# Patient Record
Sex: Female | Born: 1935 | Race: White | Hispanic: No | Smoking: Former smoker
Health system: Southern US, Community
[De-identification: ages and names within clinical notes are randomized; demographics above are authoritative.]

## PROBLEM LIST (undated history)

## (undated) DIAGNOSIS — Z973 Presence of spectacles and contact lenses: Secondary | ICD-10-CM

## (undated) DIAGNOSIS — I10 Essential (primary) hypertension: Secondary | ICD-10-CM

## (undated) DIAGNOSIS — T148XXA Other injury of unspecified body region, initial encounter: Secondary | ICD-10-CM

## (undated) DIAGNOSIS — M1990 Unspecified osteoarthritis, unspecified site: Secondary | ICD-10-CM

## (undated) HISTORY — PX: CATARACT EXTRACTION, BILATERAL: SHX1313

## (undated) HISTORY — PX: COLONOSCOPY W/ BIOPSIES AND POLYPECTOMY: SHX1376

## (undated) HISTORY — PX: PARTIAL HYSTERECTOMY: SHX80

## (undated) HISTORY — PX: FRACTURE SURGERY: SHX138

## (undated) HISTORY — PX: BREAST SURGERY: SHX581

---

## 1997-12-01 ENCOUNTER — Other Ambulatory Visit: Admission: RE | Admit: 1997-12-01 | Discharge: 1997-12-01 | Payer: Self-pay | Admitting: Obstetrics and Gynecology

## 1997-12-20 ENCOUNTER — Ambulatory Visit (HOSPITAL_COMMUNITY): Admission: RE | Admit: 1997-12-20 | Discharge: 1997-12-20 | Payer: Self-pay | Admitting: Internal Medicine

## 1998-12-05 ENCOUNTER — Other Ambulatory Visit: Admission: RE | Admit: 1998-12-05 | Discharge: 1998-12-05 | Payer: Self-pay | Admitting: Obstetrics and Gynecology

## 1998-12-06 ENCOUNTER — Ambulatory Visit (HOSPITAL_COMMUNITY): Admission: RE | Admit: 1998-12-06 | Discharge: 1998-12-06 | Payer: Self-pay | Admitting: Obstetrics and Gynecology

## 1998-12-06 ENCOUNTER — Encounter: Payer: Self-pay | Admitting: Obstetrics and Gynecology

## 1999-11-06 ENCOUNTER — Encounter: Payer: Self-pay | Admitting: Obstetrics and Gynecology

## 1999-11-06 ENCOUNTER — Ambulatory Visit (HOSPITAL_COMMUNITY): Admission: RE | Admit: 1999-11-06 | Discharge: 1999-11-06 | Payer: Self-pay | Admitting: Obstetrics and Gynecology

## 2000-01-08 ENCOUNTER — Other Ambulatory Visit: Admission: RE | Admit: 2000-01-08 | Discharge: 2000-01-08 | Payer: Self-pay | Admitting: Obstetrics and Gynecology

## 2000-11-06 ENCOUNTER — Encounter: Payer: Self-pay | Admitting: Obstetrics and Gynecology

## 2000-11-06 ENCOUNTER — Ambulatory Visit (HOSPITAL_COMMUNITY): Admission: RE | Admit: 2000-11-06 | Discharge: 2000-11-06 | Payer: Self-pay | Admitting: Obstetrics and Gynecology

## 2001-11-07 ENCOUNTER — Ambulatory Visit (HOSPITAL_COMMUNITY): Admission: RE | Admit: 2001-11-07 | Discharge: 2001-11-07 | Payer: Self-pay | Admitting: Obstetrics and Gynecology

## 2001-11-07 ENCOUNTER — Encounter: Payer: Self-pay | Admitting: Obstetrics and Gynecology

## 2002-11-02 ENCOUNTER — Ambulatory Visit (HOSPITAL_COMMUNITY): Admission: RE | Admit: 2002-11-02 | Discharge: 2002-11-02 | Payer: Self-pay | Admitting: Gastroenterology

## 2002-11-09 ENCOUNTER — Encounter: Payer: Self-pay | Admitting: Obstetrics and Gynecology

## 2002-11-09 ENCOUNTER — Ambulatory Visit (HOSPITAL_COMMUNITY): Admission: RE | Admit: 2002-11-09 | Discharge: 2002-11-09 | Payer: Self-pay | Admitting: Obstetrics and Gynecology

## 2003-01-27 ENCOUNTER — Other Ambulatory Visit: Admission: RE | Admit: 2003-01-27 | Discharge: 2003-01-27 | Payer: Self-pay | Admitting: Obstetrics and Gynecology

## 2003-11-08 ENCOUNTER — Ambulatory Visit (HOSPITAL_COMMUNITY): Admission: RE | Admit: 2003-11-08 | Discharge: 2003-11-08 | Payer: Self-pay | Admitting: Obstetrics and Gynecology

## 2004-01-12 ENCOUNTER — Observation Stay (HOSPITAL_COMMUNITY): Admission: RE | Admit: 2004-01-12 | Discharge: 2004-01-13 | Payer: Self-pay | Admitting: Orthopedic Surgery

## 2004-02-07 ENCOUNTER — Other Ambulatory Visit: Admission: RE | Admit: 2004-02-07 | Discharge: 2004-02-07 | Payer: Self-pay | Admitting: Obstetrics and Gynecology

## 2004-10-30 ENCOUNTER — Ambulatory Visit (HOSPITAL_COMMUNITY): Admission: RE | Admit: 2004-10-30 | Discharge: 2004-10-30 | Payer: Self-pay | Admitting: Obstetrics and Gynecology

## 2005-02-14 ENCOUNTER — Other Ambulatory Visit: Admission: RE | Admit: 2005-02-14 | Discharge: 2005-02-14 | Payer: Self-pay | Admitting: Obstetrics and Gynecology

## 2005-08-07 ENCOUNTER — Ambulatory Visit (HOSPITAL_BASED_OUTPATIENT_CLINIC_OR_DEPARTMENT_OTHER): Admission: RE | Admit: 2005-08-07 | Discharge: 2005-08-07 | Payer: Self-pay | Admitting: Orthopedic Surgery

## 2005-10-31 ENCOUNTER — Ambulatory Visit (HOSPITAL_COMMUNITY): Admission: RE | Admit: 2005-10-31 | Discharge: 2005-10-31 | Payer: Self-pay | Admitting: Obstetrics and Gynecology

## 2006-02-18 ENCOUNTER — Other Ambulatory Visit: Admission: RE | Admit: 2006-02-18 | Discharge: 2006-02-18 | Payer: Self-pay | Admitting: Obstetrics and Gynecology

## 2006-11-04 ENCOUNTER — Ambulatory Visit (HOSPITAL_COMMUNITY): Admission: RE | Admit: 2006-11-04 | Discharge: 2006-11-04 | Payer: Self-pay | Admitting: Internal Medicine

## 2007-02-19 ENCOUNTER — Other Ambulatory Visit: Admission: RE | Admit: 2007-02-19 | Discharge: 2007-02-19 | Payer: Self-pay | Admitting: Obstetrics and Gynecology

## 2007-11-06 ENCOUNTER — Ambulatory Visit (HOSPITAL_COMMUNITY): Admission: RE | Admit: 2007-11-06 | Discharge: 2007-11-06 | Payer: Self-pay | Admitting: Obstetrics and Gynecology

## 2008-03-19 ENCOUNTER — Other Ambulatory Visit: Admission: RE | Admit: 2008-03-19 | Discharge: 2008-03-19 | Payer: Self-pay | Admitting: Obstetrics and Gynecology

## 2008-11-08 ENCOUNTER — Ambulatory Visit (HOSPITAL_COMMUNITY): Admission: RE | Admit: 2008-11-08 | Discharge: 2008-11-08 | Payer: Self-pay | Admitting: Obstetrics and Gynecology

## 2009-03-21 ENCOUNTER — Other Ambulatory Visit: Admission: RE | Admit: 2009-03-21 | Discharge: 2009-03-21 | Payer: Self-pay | Admitting: Obstetrics and Gynecology

## 2009-11-09 ENCOUNTER — Ambulatory Visit (HOSPITAL_COMMUNITY): Admission: RE | Admit: 2009-11-09 | Discharge: 2009-11-09 | Payer: Self-pay | Admitting: Internal Medicine

## 2010-10-02 ENCOUNTER — Other Ambulatory Visit (HOSPITAL_COMMUNITY): Payer: Self-pay | Admitting: Internal Medicine

## 2010-10-02 DIAGNOSIS — Z1231 Encounter for screening mammogram for malignant neoplasm of breast: Secondary | ICD-10-CM

## 2010-11-13 ENCOUNTER — Ambulatory Visit (HOSPITAL_COMMUNITY)
Admission: RE | Admit: 2010-11-13 | Discharge: 2010-11-13 | Disposition: A | Discharge status: Home / Self Care | Payer: Medicare Other | Source: Ambulatory Visit | Attending: Internal Medicine | Admitting: Internal Medicine

## 2010-11-13 DIAGNOSIS — Z1231 Encounter for screening mammogram for malignant neoplasm of breast: Secondary | ICD-10-CM | POA: Insufficient documentation

## 2010-12-15 NOTE — Op Note (Signed)
NAME:  Diamond [name], Diamond [name]                          ACCOUNT NO.:  [id number]   MEDICAL RECORD NO.:  [id number]                   PATIENT TYPE:  AMB   LOCATION:  ENDO                                 FACILITY:  [facility]   PHYSICIAN:  Petra Kuba, M.D.                 DATE OF BIRTH:  02-27-1935   DATE OF PROCEDURE:  11/02/2002  DATE OF DISCHARGE:                                 OPERATIVE REPORT   PROCEDURE:  Colonoscopy.   INDICATIONS:  Bright red blood per rectum and history of colon polyps, due  to repeat screening.  Consent was signed after the risks, benefits, method,  and options were thoroughly discussed in the office with her and her  husband.   MEDICINES USED:  Demerol 120 mg and Versed 12.5 mg.   DESCRIPTION OF PROCEDURE:  Rectal inspection was pertinent for small  external hemorrhoids.  The digital exam was negative.  The video pediatric  adjustable colonoscope was inserted and despite a tortuous looping colon  with rolling her on her back and some abdominal pressure we were able to  advance to the cecum.  On insertion, there was some minimal rectal  inflammation, as well as some cecal inflammation with a little bit of yellow  exudate coming from the appendiceal orifice.  No other abnormalities were  seen on insertion.  The cecum was also identified by the ileocecal valve.  The scope was then slowly withdrawn.  The prep was adequate.  There was some  liquid stool that required washing and suctioning and some stool adherent to  the wall of the right colon, some of which could be suctioned off.  The  scope was then slowly withdrawn.  Other than occasional left-sided  diverticula, no abnormalities were seen.  Once back in the rectum, the scope  was retroflexed, pertinent for some internal hemorrhoids and distal  inflammation that looked more prep induced.  The scope was then straightened  and readvanced a short ways up the left side of the colon.  Air was  suctioned.  The scope  was removed.  The patient tolerated the procedure  well.  There was no obvious immediate complication.   ENDOSCOPIC DIAGNOSIS:  1. Internal and external hemorrhoids.  2. Occasional left-sided diverticula.  3. Rectal and cecal inflammation, probably prep induced.  4. Tortuous colon.  5. Otherwise within normal limits to the cecum.   PLAN:  Yearly rectals and guaiacs per Geoffry Paradise, M.D.  Happy to see  back p.r.n.  Otherwise repeat screening in five years and consider either a  virtual colonoscopy or barium enema at that juncture based on her  tortuosity, but happy to proceed with colonoscopy attempt as well.  Petra Kuba, M.D.    MEM/MEDQ  D:  11/02/2002  T:  11/02/2002  Job:  [id number]

## 2010-12-15 NOTE — Op Note (Signed)
NAME:  Diamond [name]                          ACCOUNT NO.:  [id number]   MEDICAL RECORD NO.:  [id number]                   PATIENT TYPE:  OBV   LOCATION:  0098                                 FACILITY:  [facility]   PHYSICIAN:  Almedia Balls. Ranell Patrick, M.D.              DATE OF BIRTH:  05/01/1935   DATE OF PROCEDURE:  01/12/2004  DATE OF DISCHARGE:                                 OPERATIVE REPORT   PREOPERATIVE DIAGNOSES:  Left distal radius fracture, displaced.   POSTOPERATIVE DIAGNOSES:  Left distal radius fracture, displaced.   PROCEDURE:  Open reduction and internal fixation of left distal radius  fracture using [employer] distal radial plate (DVR) system.   SURGEON:  Almedia Balls. Ranell Patrick, M.D.   FIRST ASSISTANT:  None.   ANESTHESIA:  General anesthesia was used.   ESTIMATED BLOOD LOSS:  Zero.   TOURNIQUET TIME:  1 hour.   INSTRUMENT COUNT:  Correct.   COMPLICATIONS:  None.   INDICATIONS FOR PROCEDURE:  The patient is a 76 year old who fell on her  outstretched left wrist. The patient complained of immediate pain and  swelling deformity. The left wrist presented with an obviously displaced  distal radius fracture. Given the options of long arm immobilization for six  weeks versus surgical reduction and fixation allowing for short arm  immobilization and early range of motion, the patient elected to proceed  with surgical treatment to ensure reduction and maintain reduction as well  as to allow earlier range of motion and functional activity.  Informed  consent was obtained.   DESCRIPTION OF PROCEDURE:  After an adequate level of anesthesia was  achieved, the patient was positioned supine on the operating table, a  nonsterile tourniquet was placed on the left proximal arm with arm sterilely  prepped and draped. After exsanguination of the limb using the esmarch  bandage, the tourniquet was elevated to 275 mmHg, a longitudinal skin  incision was created along the palpable FCR tendon  sheath, dissection was  carried sharply down under loupe magnification to the FCR. The sheath was  incised, the FCR tendon was retracted medially to protect the medial nerve  and the remainder of the sheath was retracted laterally to protect the  radial artery.  Dissection was carried down under direct visualization using  Metzenbaum scissors.  The pronator quadratus was taken down off the radial  aspect of the distal radius using Bovie electrocautery with a needle tip  Bovie.  Subperiosteal dissection was performed and the distal radius  fracture site was easily identified and reduced.  Finger traps were reduced  to hold the fracture, appropriately reduced __________ was brought in under  multiple __________ views demonstrate adequate reduction.  Next, the  standard left DVR plate from [employer] was placed on the volar radius and held  in position.  C-arm was checked to make sure the plate was in good position  and a single 3.5 cortical  screw was used to fix the plate to the radius.  The position of the plate was checked, it was noted to be appropriately  distal to get good purchase in the subchondral bone. A second 3.5 bicortical  screw was placed to secure the plate in this final excellent position. The  fracture was appropriately reduced and held with a single provisional 0.45 K  wire and then distal screws were placed across the distal fragment. These  were 2.5 mm threaded and smooth, locking screws and pegs. These were placed  into the distal radius fragments to immobilize the fracture. This serves as  a fixed angle plate system or DVR plate. All but the most ulnar distal row  screw holes were filled. The proximal row were filled as were the regular  styloid pin that was placed.  Outstanding fixation was achieved, the final  3.5 bicortical screw was placed in the proximal radial shaft gaining  excellent purchase. The finger traps were taken off, the wrist was taken  through a full range  of motion and was checked in multiple planes to ensure  that the screws and pegs were not in the joint.  Once this was adequately  satisfied and the patient was taken through a range of motion and a  fluoroscopy and no motion at the fracture site was identified and the wound  was thoroughly irrigated, the pronator quadratus closed over the plate using  interrupted 2-0 Vicryl suture followed by 2-0 Vicryl for subcutaneous suture  and 4-0 running Monocryl for subcuticular with Steri-Strips and a sterile  dressing and a short arm splint.  The patient tolerated the surgery well and  was taken from the OR to the PACU in stable condition.                                               Almedia Balls. Ranell Patrick, M.D.    SRN/MEDQ  D:  01/12/2004  T:  01/12/2004  Job:  [id number]

## 2010-12-15 NOTE — Op Note (Signed)
NAME:  Diamond [name], Diamond [name]                ACCOUNT NO.:  [id number]   MEDICAL RECORD NO.:  [id number]          PATIENT TYPE:  AMB   LOCATION:  DSC                          FACILITY:  [facility]   PHYSICIAN:  Leonides Grills, M.D.     DATE OF BIRTH:  November 04, 1934   DATE OF PROCEDURE:  08/07/2005  DATE OF DISCHARGE:                                 OPERATIVE REPORT   PREOPERATIVE DIAGNOSIS:  Left hallux rigidus.   POSTOPERATIVE DIAGNOSIS:  Left hallux rigidus.   OPERATION:  Left great toe cheilectomy.   ANESTHESIA:  General.   SURGEON:  Leonides Grills, M.D.   ASSISTANT:  None.   COMPLICATIONS:  None.   DISPOSITION:  Stable to the PACU.   TOURNIQUET TIME:  30 minutes.   INDICATIONS FOR PROCEDURE:  This is a 76 year old female who had persistent  dorsal left great toe pain that was interfering with her life to the point  she could not do what she wanted to do.  She was consented for the above  procedure.  All risks which include infection, neurovascular injury,  persistent pain, worsening pain, prolonged recovery, arthritis, stiffness,  and possible future fusion were all explained, questions were encouraged and  answered.   OPERATION:  The patient was brought to the operating room and placed in  supine position.  After adequate general endotracheal anesthesia was  administered as well as Ancef 1 gram IV piggyback, the left lower extremity  was then prepped and draped in a sterile manner over a proximally placed  thigh tourniquet.  The limb was gravity exsanguinated and the tourniquet was  elevated to 290 mmHg.  A longitudinal incision just medial to the EHL tendon  was then made.  Dissection was carried down through the skin and hemostasis  was obtained.  The EHL tendon was then identified and retracted laterally  out of harms way.  A longitudinal capsulotomy was then made.  Soft tissue  was elevated carefully both medially and laterally.  There was a large loose  body that was removed.  The  spur was then removed off the base of the  proximal phalanx as well as the dorsal aspect of the metatarsal head with  the sagittal saw.  Both the medial and lateral gutters were then debrided  with a rongeur and rounded off, as well.  The joint and area were ranged and  had excellent range of motion at the end of the procedure in dorsiflexion,  plantar flexion was still stiff.  The joint and area were copiously  irrigated with normal saline.  Exposed bone was covered  with bone wax.  The tourniquet was deflated prior to closure, hemostasis was  obtained.  The capsule was closed with 3-0 Vicryl, the skin was closed with  4-0 nylon.  Sterile dressing was applied, a hard sole shoe was applied.  The  patient was stable to the PR.      Leonides Grills, M.D.  Electronically Signed     PB/MEDQ  D:  08/07/2005  T:  08/07/2005  Job:  [id number]

## 2011-10-10 ENCOUNTER — Other Ambulatory Visit (HOSPITAL_COMMUNITY): Payer: Self-pay | Admitting: Obstetrics & Gynecology

## 2011-10-10 DIAGNOSIS — Z1231 Encounter for screening mammogram for malignant neoplasm of breast: Secondary | ICD-10-CM

## 2011-11-14 ENCOUNTER — Ambulatory Visit (HOSPITAL_COMMUNITY)
Admission: RE | Admit: 2011-11-14 | Discharge: 2011-11-14 | Disposition: A | Discharge status: Home / Self Care | Payer: Medicare Other | Source: Ambulatory Visit | Attending: Obstetrics & Gynecology | Admitting: Obstetrics & Gynecology

## 2011-11-14 DIAGNOSIS — Z1231 Encounter for screening mammogram for malignant neoplasm of breast: Secondary | ICD-10-CM

## 2012-10-13 ENCOUNTER — Other Ambulatory Visit (HOSPITAL_COMMUNITY): Payer: Self-pay | Admitting: Obstetrics & Gynecology

## 2012-11-14 ENCOUNTER — Ambulatory Visit (HOSPITAL_COMMUNITY)
Admission: RE | Admit: 2012-11-14 | Discharge: 2012-11-14 | Disposition: A | Discharge status: Home / Self Care | Payer: Medicare Other | Source: Ambulatory Visit | Attending: Obstetrics & Gynecology | Admitting: Obstetrics & Gynecology

## 2012-11-14 DIAGNOSIS — Z1231 Encounter for screening mammogram for malignant neoplasm of breast: Secondary | ICD-10-CM | POA: Insufficient documentation

## 2013-07-20 ENCOUNTER — Encounter: Payer: Self-pay | Admitting: Podiatry

## 2013-07-20 ENCOUNTER — Ambulatory Visit (INDEPENDENT_AMBULATORY_CARE_PROVIDER_SITE_OTHER): Payer: Medicare Other | Admitting: Podiatry

## 2013-07-20 VITALS — BP 161/91 | HR 68 | Resp 18

## 2013-07-20 DIAGNOSIS — M779 Enthesopathy, unspecified: Secondary | ICD-10-CM

## 2013-07-20 DIAGNOSIS — L84 Corns and callosities: Secondary | ICD-10-CM

## 2013-07-20 MED ORDER — TRIAMCINOLONE ACETONIDE 10 MG/ML IJ SUSP
10.0000 mg | Freq: Once | INTRAMUSCULAR | Status: AC
  Administered 2013-07-20: 10 mg

## 2013-07-20 NOTE — Progress Notes (Signed)
° °  Subjective:    Patient ID: Diamond [name], female    DOB: [birth year], 78 y.o.   MRN: [id number]  HPI Dr Charlsie Merles trims on this place and sprays this toe and trims on them    Review of Systems     Objective:   Physical Exam        Assessment & Plan:

## 2013-07-20 NOTE — Progress Notes (Signed)
Subjective:     Patient ID: Diamond [name], female   DOB: March 13, 1935, 78 y.o.   MRN: [id number]  HPI patient presents with lesion plantar aspect fourth interspace and fifth digit right which is somewhat better and keratotic lesion left fifth toe with pain and fluid buildup around the interphalangeal joint   Review of Systems     Objective:   Physical Exam Neurovascular status intact with keratotic tissue sub-fifth digit right and into the web space and inflammation with fluid interphalangeal joint medial side fifth toe left foot    Assessment:     Keratotic tissue formation right improving and inflammatory capsulitis left fifth toe was spur formation and lesion    Plan:     Explained both conditions and did careful capsular injection left to milligrams dexamethasone Kenalog 2 mg Xylocaine and debrided lesions on both feet. Reappoint her recheck as needed

## 2013-09-28 ENCOUNTER — Encounter: Payer: Self-pay | Admitting: Podiatry

## 2013-09-28 ENCOUNTER — Ambulatory Visit: Payer: Medicare Other | Admitting: Podiatry

## 2013-09-28 ENCOUNTER — Ambulatory Visit (INDEPENDENT_AMBULATORY_CARE_PROVIDER_SITE_OTHER): Payer: Medicare Other | Admitting: Podiatry

## 2013-09-28 VITALS — BP 163/93 | HR 72 | Resp 16

## 2013-09-28 DIAGNOSIS — M779 Enthesopathy, unspecified: Secondary | ICD-10-CM

## 2013-09-28 DIAGNOSIS — L84 Corns and callosities: Secondary | ICD-10-CM

## 2013-09-28 MED ORDER — TRIAMCINOLONE ACETONIDE 10 MG/ML IJ SUSP
10.0000 mg | Freq: Once | INTRAMUSCULAR | Status: AC
  Administered 2013-09-28: 10 mg

## 2013-09-29 NOTE — Progress Notes (Signed)
Subjective:     Patient ID: Diamond [name], female   DOB: April 21, 1935, 79 y.o.   MRN: [id number]  HPI patient presents with a painful lesion on the fifth toe left foot and her side with fluid buildup and keratotic tissue formation   Review of Systems     Objective:   Physical Exam Neurovascular status intact with patient well oriented x3 and lesion inner side fifth toe at the level the head of the proximal phalanx with fluid buildup and pain when pressed    Assessment:     Capsular inflammation inside fifth toe left foot with pain and fluid buildup along with lesion formation    Plan:     Careful injection of the interphalangeal lesion to milligrams dexamethasone Kenalog combination with Xylocaine and debridement of lesion with bandage application

## 2013-10-06 ENCOUNTER — Other Ambulatory Visit (HOSPITAL_COMMUNITY): Payer: Self-pay | Admitting: Internal Medicine

## 2013-10-06 DIAGNOSIS — Z1231 Encounter for screening mammogram for malignant neoplasm of breast: Secondary | ICD-10-CM

## 2013-11-02 ENCOUNTER — Ambulatory Visit (INDEPENDENT_AMBULATORY_CARE_PROVIDER_SITE_OTHER): Payer: Medicare Other | Admitting: Podiatry

## 2013-11-02 ENCOUNTER — Encounter: Payer: Self-pay | Admitting: Podiatry

## 2013-11-02 VITALS — BP 160/86 | HR 70 | Resp 17 | Ht 64.0 in | Wt 133.0 lb

## 2013-11-02 DIAGNOSIS — M779 Enthesopathy, unspecified: Secondary | ICD-10-CM

## 2013-11-02 DIAGNOSIS — L84 Corns and callosities: Secondary | ICD-10-CM

## 2013-11-02 MED ORDER — TRIAMCINOLONE ACETONIDE 10 MG/ML IJ SUSP
10.0000 mg | Freq: Once | INTRAMUSCULAR | Status: AC
  Administered 2013-11-02: 10 mg

## 2013-11-02 NOTE — Progress Notes (Signed)
Subjective:     Patient ID: Francy L [place], female   DOB: October 12, 1934, 79 y.o.   MRN: [id number]  HPI patient points to left fifth toe stating it's becoming inflamed again and if we can get a better him to have to have it fixed   Review of Systems     Objective:   Physical Exam Neurovascular status unchanged with inflamed medial side the left fifth toe at the proximal phalanx with fluid buildup in keratotic lesion formation    Assessment:     Interspace lesion with pain fifth toe left with keratotic tissue formation    Plan:     One more attempt at conservative care into a small injection to milligrams dexamethasone Kenalog after appropriate numbness debris the lesion applied padding and bandage and reappoint when symptomatic for consideration of surgery

## 2013-11-16 ENCOUNTER — Ambulatory Visit (HOSPITAL_COMMUNITY)
Admission: RE | Admit: 2013-11-16 | Discharge: 2013-11-16 | Disposition: A | Discharge status: Home / Self Care | Payer: Medicare Other | Source: Ambulatory Visit | Attending: Internal Medicine | Admitting: Internal Medicine

## 2013-11-16 DIAGNOSIS — Z1231 Encounter for screening mammogram for malignant neoplasm of breast: Secondary | ICD-10-CM

## 2014-06-07 ENCOUNTER — Encounter: Payer: Self-pay | Admitting: Podiatry

## 2014-06-07 ENCOUNTER — Ambulatory Visit (INDEPENDENT_AMBULATORY_CARE_PROVIDER_SITE_OTHER): Payer: Medicare Other | Admitting: Podiatry

## 2014-06-07 VITALS — BP 166/100 | HR 78 | Resp 16

## 2014-06-07 DIAGNOSIS — L923 Foreign body granuloma of the skin and subcutaneous tissue: Secondary | ICD-10-CM

## 2014-06-08 NOTE — Progress Notes (Signed)
Subjective:     Patient ID: Diamond [name], female   DOB: [birth year], 79 y.o.   MRN: [id number]  HPIpatient points plantar aspect left foot states that she has a lesion that's been painful and she is worried she stepped on something   Review of Systems     Objective:   Physical Exam Neurovascular status intact with thick keratotic lesion sub-first metatarsal left with possibility of foreign body    Assessment:     Lesion left which may be strictly mechanical or possible foreign body    Plan:     Reviewed condition and did deep debridement of lesion with no iatrogenic bleeding noted and applied padding to the area and reappoint as needed

## 2014-08-23 DIAGNOSIS — E785 Hyperlipidemia, unspecified: Secondary | ICD-10-CM | POA: Insufficient documentation

## 2014-10-12 ENCOUNTER — Other Ambulatory Visit (HOSPITAL_COMMUNITY): Payer: Self-pay | Admitting: Internal Medicine

## 2014-10-12 DIAGNOSIS — Z1231 Encounter for screening mammogram for malignant neoplasm of breast: Secondary | ICD-10-CM

## 2014-11-18 ENCOUNTER — Ambulatory Visit (HOSPITAL_COMMUNITY)
Admission: RE | Admit: 2014-11-18 | Discharge: 2014-11-18 | Disposition: A | Discharge status: Home / Self Care | Payer: Medicare Other | Source: Ambulatory Visit | Attending: Internal Medicine | Admitting: Internal Medicine

## 2014-11-18 DIAGNOSIS — Z1231 Encounter for screening mammogram for malignant neoplasm of breast: Secondary | ICD-10-CM | POA: Diagnosis present

## 2015-10-24 ENCOUNTER — Other Ambulatory Visit: Payer: Self-pay

## 2015-10-24 DIAGNOSIS — Z1231 Encounter for screening mammogram for malignant neoplasm of breast: Secondary | ICD-10-CM

## 2015-11-21 ENCOUNTER — Ambulatory Visit
Admission: RE | Admit: 2015-11-21 | Discharge: 2015-11-21 | Disposition: A | Discharge status: Home / Self Care | Payer: Medicare Other | Source: Ambulatory Visit

## 2015-11-21 DIAGNOSIS — Z1231 Encounter for screening mammogram for malignant neoplasm of breast: Secondary | ICD-10-CM

## 2016-04-21 IMAGING — MG MM SCREENING BREAST TOMO BILATERAL
8 series · 9 of 24 positions shown · non-contrast
Comparison: Previous exam(s).

CLINICAL DATA: Screening.

EXAM:
DIGITAL SCREENING BILATERAL MAMMOGRAM WITH 3D TOMO WITH CAD

[R MLO]
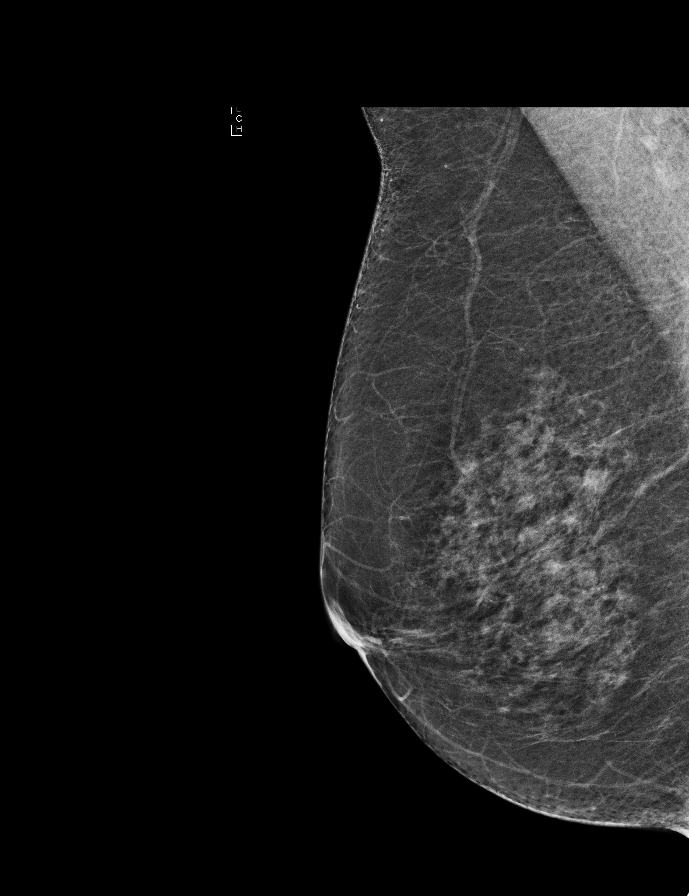

[R CC]
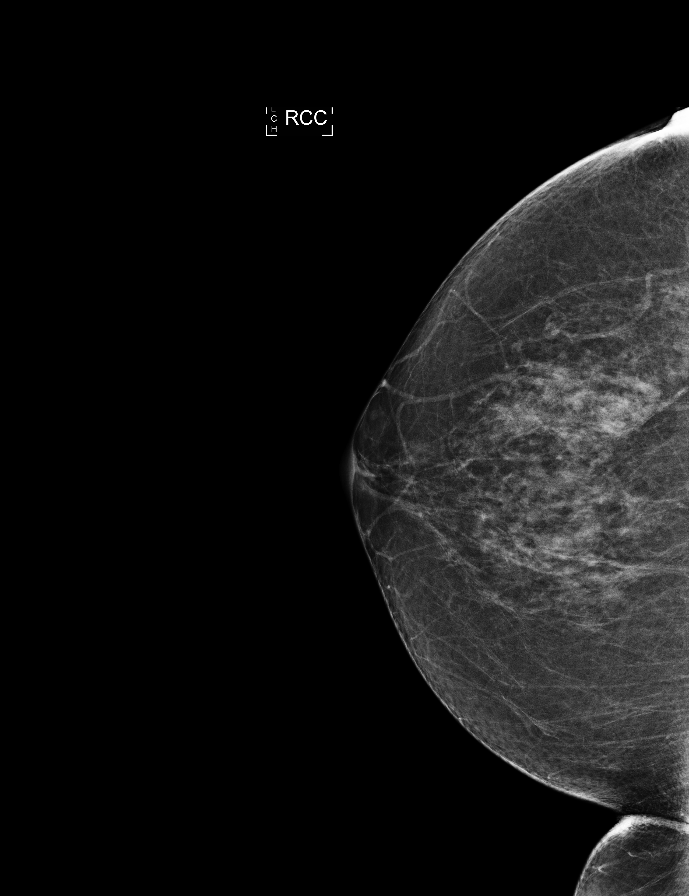

[L MLO]
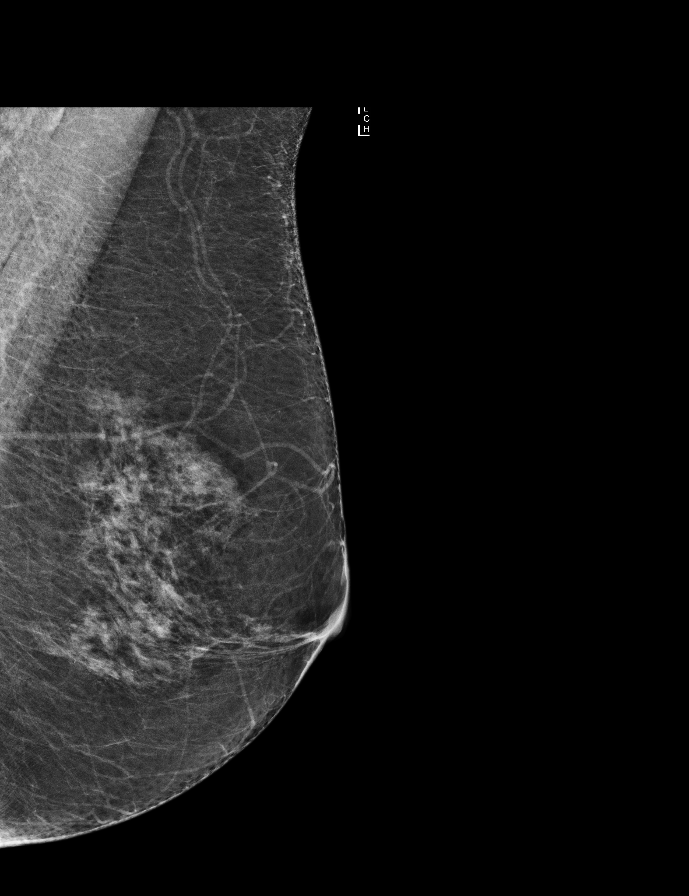

[L CC]
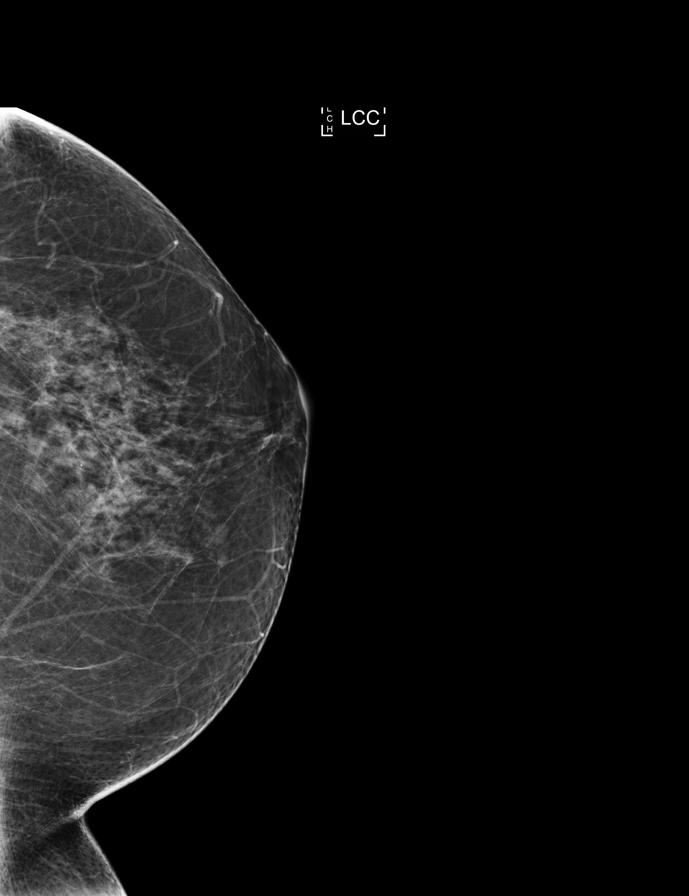

[R CC tomo · 2 of 71 frames shown]
[frame 23/71]
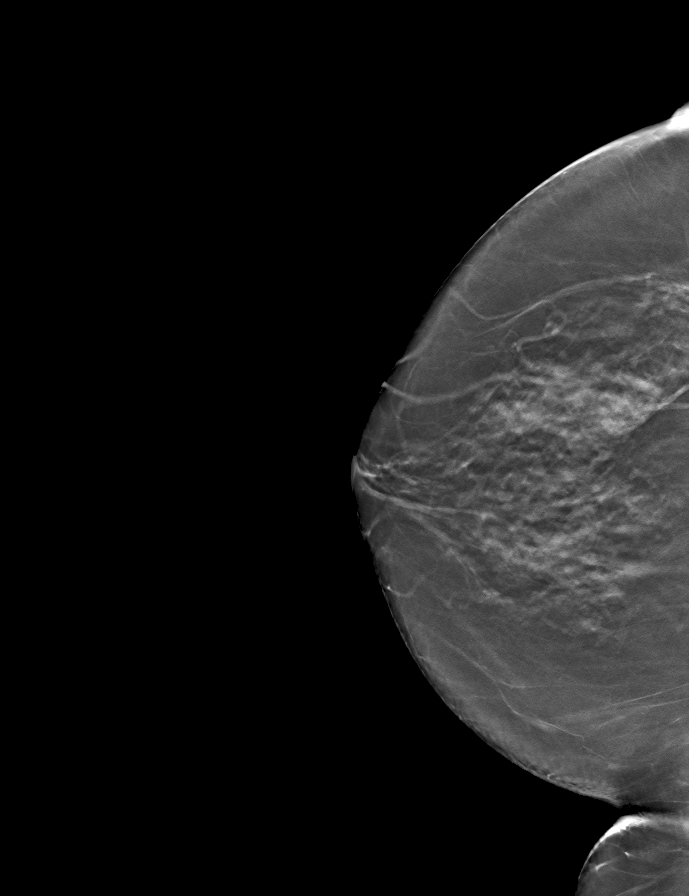
[frame 36/71]
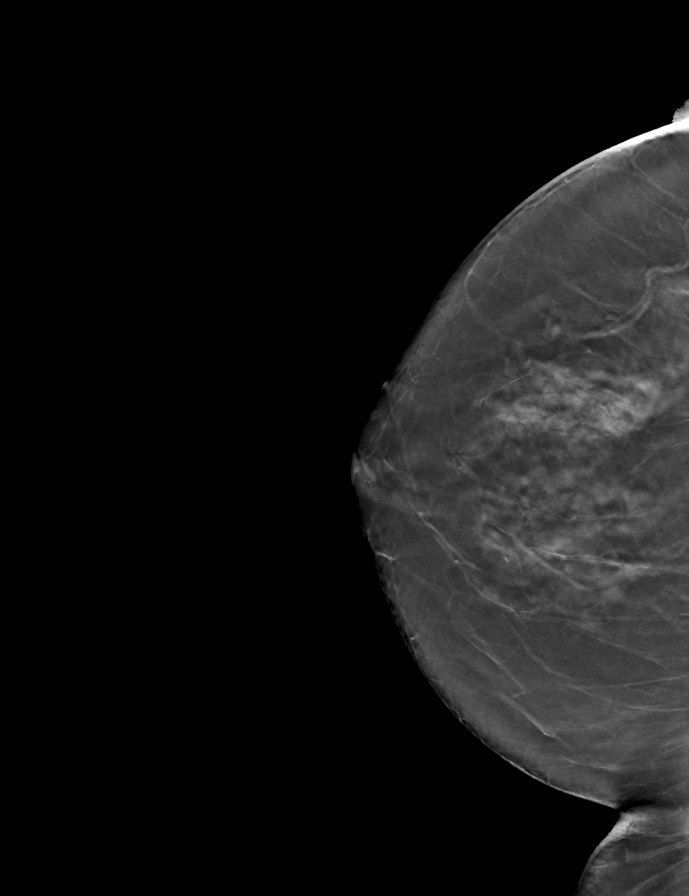

[R MLO tomo · tomo slice 34/67.0]
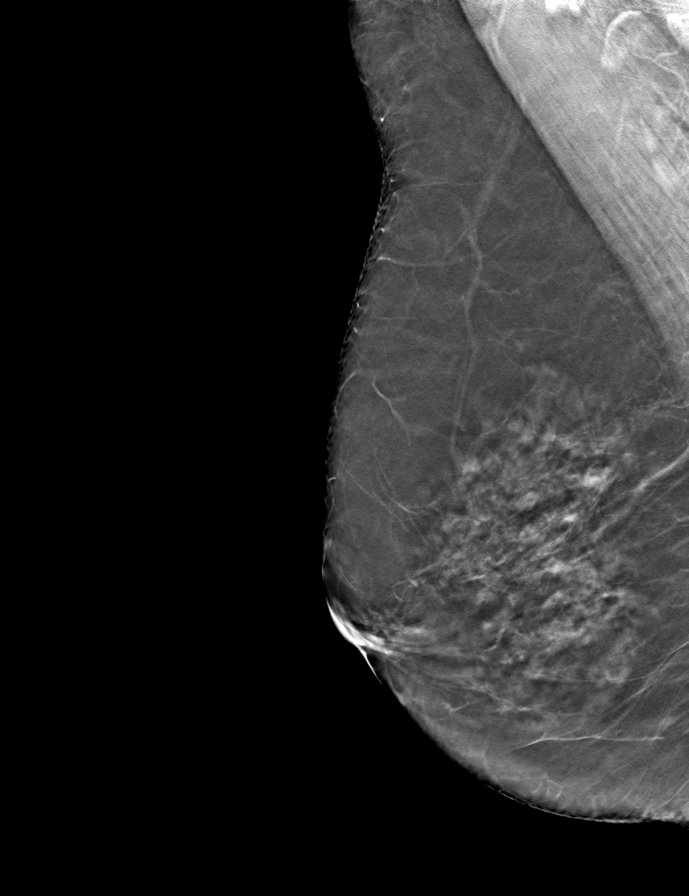

[L MLO tomo · tomo slice 35/69.0]
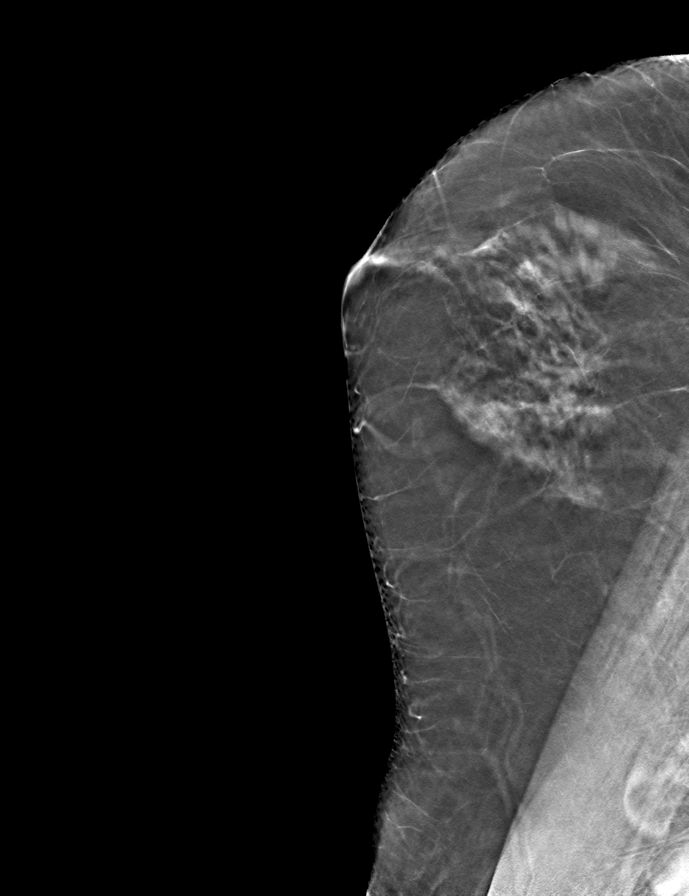

[L CC tomo · tomo slice 35/68.0]
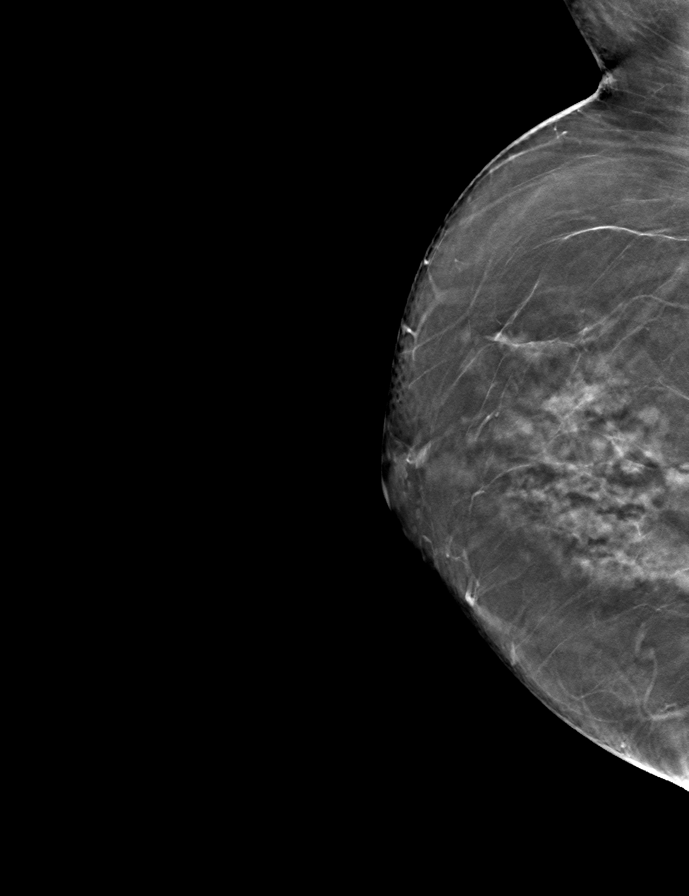

[9 of 24 positions shown; findings below may reference images not displayed]

ACR Breast Density Category c: The breast tissue is heterogeneously
dense, which may obscure small masses.
FINDINGS: There are no findings suspicious for malignancy. Images were
processed with CAD.
IMPRESSION: No mammographic evidence of malignancy. A result letter of this
screening mammogram will be mailed directly to the patient.

RECOMMENDATION:
Screening mammogram in one year. (Code:OA-G-1SS)

BI-RADS CATEGORY  1: Negative.

## 2016-10-12 ENCOUNTER — Other Ambulatory Visit: Payer: Self-pay | Admitting: Internal Medicine

## 2016-10-12 DIAGNOSIS — Z1231 Encounter for screening mammogram for malignant neoplasm of breast: Secondary | ICD-10-CM

## 2016-12-10 ENCOUNTER — Ambulatory Visit
Admission: RE | Admit: 2016-12-10 | Discharge: 2016-12-10 | Disposition: A | Discharge status: Home / Self Care | Payer: Medicare Other | Source: Ambulatory Visit | Attending: Internal Medicine | Admitting: Internal Medicine

## 2016-12-10 DIAGNOSIS — Z1231 Encounter for screening mammogram for malignant neoplasm of breast: Secondary | ICD-10-CM

## 2017-11-01 ENCOUNTER — Other Ambulatory Visit: Payer: Self-pay | Admitting: Internal Medicine

## 2017-11-01 DIAGNOSIS — Z1231 Encounter for screening mammogram for malignant neoplasm of breast: Secondary | ICD-10-CM

## 2017-12-11 ENCOUNTER — Ambulatory Visit
Admission: RE | Admit: 2017-12-11 | Discharge: 2017-12-11 | Disposition: A | Discharge status: Home / Self Care | Payer: Medicare Other | Source: Ambulatory Visit | Attending: Internal Medicine | Admitting: Internal Medicine

## 2017-12-11 DIAGNOSIS — Z1231 Encounter for screening mammogram for malignant neoplasm of breast: Secondary | ICD-10-CM

## 2018-08-06 ENCOUNTER — Other Ambulatory Visit: Payer: Self-pay | Admitting: Podiatry

## 2018-08-06 ENCOUNTER — Ambulatory Visit: Payer: Medicare Other | Admitting: Podiatry

## 2018-08-06 ENCOUNTER — Ambulatory Visit (INDEPENDENT_AMBULATORY_CARE_PROVIDER_SITE_OTHER): Payer: Medicare Other

## 2018-08-06 ENCOUNTER — Encounter: Payer: Self-pay | Admitting: Podiatry

## 2018-08-06 DIAGNOSIS — M79672 Pain in left foot: Secondary | ICD-10-CM

## 2018-08-06 DIAGNOSIS — B351 Tinea unguium: Secondary | ICD-10-CM

## 2018-08-06 DIAGNOSIS — M205X2 Other deformities of toe(s) (acquired), left foot: Secondary | ICD-10-CM | POA: Diagnosis not present

## 2018-08-06 NOTE — Progress Notes (Signed)
Subjective:   Patient ID: Diamond [name], female   DOB: 84 y.o.   MRN: [id number]   HPI Patient presents with yellow nails hallux left over right with inflammation of the first MPJ mild in nature left that is present and tender.  Patient has mild elevation of the nailbeds and has always worn polish and was concerned about this.  Patient does not smoke likes to be active   Review of Systems  All other systems reviewed and are negative.       Objective:  Physical Exam Vitals signs and nursing note reviewed.  Constitutional:      Appearance: She is well-developed.  Pulmonary:     Effort: Pulmonary effort is normal.  Musculoskeletal: Normal range of motion.  Skin:    General: Skin is warm.  Neurological:     Mental Status: She is alert.     Neurovascular status found to be intact muscle strength is adequate with patient noted to have mild inflammation around the first MPJ left with discoloration of the nailbed left over right with the distal two thirds of the nailbed of both bilateral with white light chalky substance on the nailbeds themselves.  Good digital perfusion well oriented x3     Assessment:  Probability for mycotic nail infection with possibility that is related to trauma with hallux limitus deformity left over right     Plan:  H&P conditions reviewed and at this point I recommended formula 7 to try to reduce the fungal count of the nailbed along with monitoring of the hallux limitus condition which I educated her on today.  Possibility for oral medicine fungal cultures or laser depending on the response to topical medicine and nail changes as far as college

## 2018-11-03 ENCOUNTER — Other Ambulatory Visit: Payer: Self-pay | Admitting: Internal Medicine

## 2018-11-03 DIAGNOSIS — Z1231 Encounter for screening mammogram for malignant neoplasm of breast: Secondary | ICD-10-CM

## 2018-12-31 ENCOUNTER — Ambulatory Visit
Admission: RE | Admit: 2018-12-31 | Discharge: 2018-12-31 | Disposition: A | Discharge status: Home / Self Care | Payer: Medicare Other | Source: Ambulatory Visit | Attending: Internal Medicine | Admitting: Internal Medicine

## 2018-12-31 ENCOUNTER — Other Ambulatory Visit: Payer: Self-pay

## 2018-12-31 DIAGNOSIS — Z1231 Encounter for screening mammogram for malignant neoplasm of breast: Secondary | ICD-10-CM

## 2019-01-01 ENCOUNTER — Other Ambulatory Visit: Payer: Self-pay | Admitting: Internal Medicine

## 2019-01-01 DIAGNOSIS — R921 Mammographic calcification found on diagnostic imaging of breast: Secondary | ICD-10-CM

## 2019-01-05 ENCOUNTER — Other Ambulatory Visit: Payer: Self-pay

## 2019-01-05 ENCOUNTER — Ambulatory Visit
Admission: RE | Admit: 2019-01-05 | Discharge: 2019-01-05 | Disposition: A | Discharge status: Home / Self Care | Payer: Medicare Other | Source: Ambulatory Visit | Attending: Internal Medicine | Admitting: Internal Medicine

## 2019-01-05 DIAGNOSIS — R921 Mammographic calcification found on diagnostic imaging of breast: Secondary | ICD-10-CM

## 2019-08-06 ENCOUNTER — Ambulatory Visit (INDEPENDENT_AMBULATORY_CARE_PROVIDER_SITE_OTHER): Payer: Medicare Other

## 2019-08-06 ENCOUNTER — Ambulatory Visit (HOSPITAL_COMMUNITY)
Admission: EM | Admit: 2019-08-06 | Discharge: 2019-08-06 | Disposition: A | Discharge status: Home / Self Care | Payer: Medicare Other | Attending: Family Medicine | Admitting: Family Medicine

## 2019-08-06 ENCOUNTER — Encounter (HOSPITAL_COMMUNITY): Payer: Self-pay

## 2019-08-06 ENCOUNTER — Other Ambulatory Visit: Payer: Self-pay

## 2019-08-06 DIAGNOSIS — Z1152 Encounter for screening for COVID-19: Secondary | ICD-10-CM

## 2019-08-06 DIAGNOSIS — S52021A Displaced fracture of olecranon process without intraarticular extension of right ulna, initial encounter for closed fracture: Secondary | ICD-10-CM

## 2019-08-06 LAB — POC SARS CORONAVIRUS 2 AG -  ED: SARS Coronavirus 2 Ag: NEGATIVE

## 2019-08-06 LAB — POC SARS CORONAVIRUS 2 AG: SARS Coronavirus 2 Ag: NEGATIVE

## 2019-08-06 NOTE — ED Provider Notes (Signed)
[facility]   [id number] 08/06/19 Arrival Time: [id number]  ASSESSMENT & PLAN:  1. Closed fracture of olecranon process of right ulna, initial encounter     I have personally viewed the imaging studies ordered this visit. Displaced olecranon process fracture.  Neuro/vasc intact. I spoke with Dr. Griffin Basil. Pt placed in post long arm splint by orthopaedic tech; sling provided. She declines pain medication. To see Dr. Griffin Basil this afternoon in clinic. Will need surgery.  POC COVID test negative here. Tested in anticipation of surgery.   Follow-up Information    Go to  Hiram Gash, MD.   Specialty: Orthopedic Surgery Contact information: [address] [place] [address] [place] [address] [phone]           Reviewed expectations re: course of current medical issues. Questions answered. Outlined signs and symptoms indicating need for more acute intervention. Patient verbalized understanding. After Visit Summary given.  SUBJECTIVE: History from: patient. Diamond [name] is a 85 y.o. female who reports persistent moderate pain of her right elbow; described as aching; without radiation. Onset: abrupt. First noted: today. Injury/trama: reports falling this morning onto her R elbow; immediate pain and swelling. EMS called; evaluated and sent here for further evaluation. Symptoms have progressed to a point and plateaued since beginning. Aggravating factors: certain movements of elbow. Alleviating factors: holding elbow flexed. Associated symptoms: none reported. Extremity sensation changes or weakness: none. Self treatment: reports none needed. History of similar: no. No head injury reported.  History reviewed. No pertinent surgical history.   ROS: As per HPI. All other systems negative.    OBJECTIVE:  Vitals:   08/06/19 1224 08/06/19 1225  BP: (!) 149/82   Pulse: 76   Resp: 18   Temp: 98.4 F (36.9 C)   TempSrc: Oral   SpO2: 98%   Weight:  60.8 kg     General appearance: alert; no distress HEENT: [place]; AT Neck: supple with FROM Resp: unlabored respirations Extremities: . RUE: warm with well perfused appearance; poorly localized moderate tenderness over right elbow; without gross deformities; swelling: significant; bruising: moderate; elbow ROM: able to almost fully extend arm at elbow; pain with flexion CV: brisk extremity capillary refill of RUE; 2+ radial pulse of RUE. Skin: warm and dry; no visible rashes Neurologic: gait normal; normal sensation of RUE; normal strength of RUE Psychological: alert and cooperative; normal mood and affect  Imaging: DG Elbow Complete Right  Result Date: 08/06/2019 CLINICAL DATA:  Right elbow pain with swelling and bruising and decreased range of motion secondary to a fall this morning. EXAM: RIGHT ELBOW - COMPLETE 3+ VIEW COMPARISON:  None. FINDINGS: There is a distracted fracture of the olecranon process of the proximal ulna. The distraction is approximately 2 cm. Prominent soft tissue swelling posteriorly at the fracture site. Distal humerus and radius are intact. No dislocation. Hemarthrosis. Calcific tendinopathy at the origin of the common extensor tendon at the lateral epicondyle of the distal humerus. IMPRESSION: 1. Acute distracted fracture of the olecranon process of the proximal ulna. 2. Hemarthrosis. Electronically Signed   By: Lorriane Shire M.D.   On: 08/06/2019 12:59      Allergies  Allergen Reactions  . Penicillins      Social History   Socioeconomic History  . Marital status: Widowed    Spouse name: Not on file  . Number of children: Not on file  . Years of education: Not on file  . Highest education level: Not on file  Occupational History  .  Not on file  Tobacco Use  . Smoking status: Never Smoker  . Smokeless tobacco: Never Used  Substance and Sexual Activity  . Alcohol use: Yes  . Drug use: No  . Sexual activity: Not on file  Other Topics Concern  . Not on file  Social  History Narrative  . Not on file   Social Determinants of Health   Financial Resource Strain:   . Difficulty of Paying Living Expenses: Not on file  Food Insecurity:   . Worried About Charity fundraiser in the Last Year: Not on file  . Ran Out of Food in the Last Year: Not on file  Transportation Needs:   . Lack of Transportation (Medical): Not on file  . Lack of Transportation (Non-Medical): Not on file  Physical Activity:   . Days of Exercise per Week: Not on file  . Minutes of Exercise per Session: Not on file  Stress:   . Feeling of Stress : Not on file  Social Connections:   . Frequency of Communication with Friends and Family: Not on file  . Frequency of Social Gatherings with Friends and Family: Not on file  . Attends Religious Services: Not on file  . Active Member of Clubs or Organizations: Not on file  . Attends Archivist Meetings: Not on file  . Marital Status: Not on file   History reviewed. No pertinent family history. History reviewed. No pertinent surgical history.    Vanessa Kick, MD 08/06/19 1421

## 2019-08-06 NOTE — Discharge Instructions (Addendum)
Dr Griffin Basil will see you in clinic this afternoon.

## 2019-08-06 NOTE — ED Notes (Signed)
Ortho aware of need for posterior long arm splint.

## 2019-08-06 NOTE — Progress Notes (Signed)
Orthopedic Tech Progress Note Patient Details:  Diamond [name] 06/07/1935 [id number]  Ortho Devices Type of Ortho Device: Arm sling, Long arm splint Ortho Device/Splint Location: RUE Ortho Device/Splint Interventions: Adjustment, Application, Ordered   Post Interventions Patient Tolerated: Well Instructions Provided: Care of device, Adjustment of device   Janit Pagan 08/06/2019, 2:13 PM

## 2019-08-07 ENCOUNTER — Other Ambulatory Visit: Payer: Self-pay

## 2019-08-07 ENCOUNTER — Other Ambulatory Visit (HOSPITAL_COMMUNITY)
Admission: RE | Admit: 2019-08-07 | Discharge: 2019-08-07 | Disposition: A | Discharge status: Home / Self Care | Payer: Medicare Other | Source: Ambulatory Visit | Attending: Orthopedic Surgery | Admitting: Orthopedic Surgery

## 2019-08-07 ENCOUNTER — Encounter (HOSPITAL_COMMUNITY): Payer: Self-pay | Admitting: Orthopedic Surgery

## 2019-08-07 DIAGNOSIS — Z01812 Encounter for preprocedural laboratory examination: Secondary | ICD-10-CM | POA: Insufficient documentation

## 2019-08-07 DIAGNOSIS — Z20822 Contact with and (suspected) exposure to covid-19: Secondary | ICD-10-CM | POA: Insufficient documentation

## 2019-08-07 LAB — SARS CORONAVIRUS 2 (TAT 6-24 HRS): SARS Coronavirus 2: NEGATIVE

## 2019-08-07 NOTE — Progress Notes (Signed)
Pt denies SOB, chest pain, and being under the care of a cardiologist. Pt stated that she is under the care of Dr, Reynaldo Minium, PCP. Pt denies having a stress test, echo and cardiac cath. Pt denies having a chest x ray and EKG. Pt denies recent labs. Pt made aware to stop taking vitamins, fish oil and herbal medications. Pt verbalized understanding of all pre-op instructions.

## 2019-08-08 ENCOUNTER — Encounter (HOSPITAL_COMMUNITY): Payer: Self-pay | Admitting: Orthopedic Surgery

## 2019-08-08 ENCOUNTER — Ambulatory Visit (HOSPITAL_COMMUNITY): Payer: Medicare Other | Admitting: Anesthesiology

## 2019-08-08 ENCOUNTER — Ambulatory Visit (HOSPITAL_COMMUNITY)
Admission: RE | Admit: 2019-08-08 | Discharge: 2019-08-08 | Disposition: A | Discharge status: Home / Self Care | Payer: Medicare Other | Source: Ambulatory Visit | Attending: Orthopedic Surgery | Admitting: Orthopedic Surgery

## 2019-08-08 ENCOUNTER — Encounter (HOSPITAL_COMMUNITY)
Admission: RE | Disposition: A | Discharge status: Home / Self Care | Payer: Self-pay | Source: Ambulatory Visit | Attending: Orthopedic Surgery

## 2019-08-08 ENCOUNTER — Other Ambulatory Visit: Payer: Self-pay

## 2019-08-08 DIAGNOSIS — X58XXXA Exposure to other specified factors, initial encounter: Secondary | ICD-10-CM | POA: Insufficient documentation

## 2019-08-08 DIAGNOSIS — Z87891 Personal history of nicotine dependence: Secondary | ICD-10-CM | POA: Insufficient documentation

## 2019-08-08 DIAGNOSIS — S52021A Displaced fracture of olecranon process without intraarticular extension of right ulna, initial encounter for closed fracture: Secondary | ICD-10-CM | POA: Insufficient documentation

## 2019-08-08 HISTORY — DX: Presence of spectacles and contact lenses: Z97.3

## 2019-08-08 HISTORY — PX: ORIF ELBOW FRACTURE: SHX5031

## 2019-08-08 HISTORY — DX: Other injury of unspecified body region, initial encounter: T14.8XXA

## 2019-08-08 SURGERY — OPEN REDUCTION INTERNAL FIXATION (ORIF) ELBOW/OLECRANON FRACTURE
Anesthesia: Monitor Anesthesia Care | Site: Elbow | Laterality: Right

## 2019-08-08 MED ORDER — PROPOFOL 500 MG/50ML IV EMUL
INTRAVENOUS | Status: DC | PRN
  Administered 2019-08-08: 75 ug/kg/min via INTRAVENOUS

## 2019-08-08 MED ORDER — ONDANSETRON HCL 4 MG/2ML IJ SOLN
INTRAMUSCULAR | Status: AC
  Filled 2019-08-08: qty 4

## 2019-08-08 MED ORDER — 0.9 % SODIUM CHLORIDE (POUR BTL) OPTIME
TOPICAL | Status: DC | PRN
  Administered 2019-08-08 (×2): 1000 mL

## 2019-08-08 MED ORDER — PROPOFOL 10 MG/ML IV BOLUS
INTRAVENOUS | Status: AC
  Filled 2019-08-08: qty 20

## 2019-08-08 MED ORDER — CELECOXIB 200 MG PO CAPS
ORAL_CAPSULE | ORAL | Status: AC
  Administered 2019-08-08: 400 mg via ORAL
  Filled 2019-08-08: qty 2

## 2019-08-08 MED ORDER — BUPIVACAINE-EPINEPHRINE (PF) 0.5% -1:200000 IJ SOLN
INTRAMUSCULAR | Status: DC | PRN
  Administered 2019-08-08: 30 mL

## 2019-08-08 MED ORDER — LACTATED RINGERS IV SOLN: INTRAVENOUS | Status: DC

## 2019-08-08 MED ORDER — ACETAMINOPHEN 500 MG PO TABS: 1000.0000 mg | ORAL_TABLET | Freq: Once | ORAL | Status: AC

## 2019-08-08 MED ORDER — EPHEDRINE SULFATE-NACL 50-0.9 MG/10ML-% IV SOSY
PREFILLED_SYRINGE | INTRAVENOUS | Status: DC | PRN
  Administered 2019-08-08: 10 mg via INTRAVENOUS

## 2019-08-08 MED ORDER — CHLORHEXIDINE GLUCONATE 4 % EX LIQD: 60.0000 mL | Freq: Once | CUTANEOUS | Status: DC

## 2019-08-08 MED ORDER — FENTANYL CITRATE (PF) 100 MCG/2ML IJ SOLN
INTRAMUSCULAR | Status: DC | PRN
  Administered 2019-08-08 (×2): 50 ug via INTRAVENOUS

## 2019-08-08 MED ORDER — FENTANYL CITRATE (PF) 250 MCG/5ML IJ SOLN
INTRAMUSCULAR | Status: AC
  Filled 2019-08-08: qty 5

## 2019-08-08 MED ORDER — BUPIVACAINE HCL (PF) 0.25 % IJ SOLN
INTRAMUSCULAR | Status: AC
  Filled 2019-08-08: qty 30

## 2019-08-08 MED ORDER — ONDANSETRON HCL 4 MG/2ML IJ SOLN
INTRAMUSCULAR | Status: DC | PRN
  Administered 2019-08-08: 4 mg via INTRAVENOUS

## 2019-08-08 MED ORDER — CELECOXIB 200 MG PO CAPS: 400.0000 mg | ORAL_CAPSULE | Freq: Once | ORAL | Status: AC

## 2019-08-08 MED ORDER — MIDAZOLAM HCL 2 MG/2ML IJ SOLN
INTRAMUSCULAR | Status: DC | PRN
  Administered 2019-08-08: 1 mg via INTRAVENOUS

## 2019-08-08 MED ORDER — PHENYLEPHRINE HCL-NACL 10-0.9 MG/250ML-% IV SOLN
INTRAVENOUS | Status: DC | PRN
  Administered 2019-08-08: 20 ug/min via INTRAVENOUS

## 2019-08-08 MED ORDER — EPHEDRINE 5 MG/ML INJ
INTRAVENOUS | Status: AC
  Filled 2019-08-08: qty 10

## 2019-08-08 MED ORDER — VANCOMYCIN HCL IN DEXTROSE 1-5 GM/200ML-% IV SOLN
1000.0000 mg | INTRAVENOUS | Status: AC
  Administered 2019-08-08: 10:00:00 1000 mg via INTRAVENOUS

## 2019-08-08 MED ORDER — VANCOMYCIN HCL IN DEXTROSE 1-5 GM/200ML-% IV SOLN
INTRAVENOUS | Status: AC
  Filled 2019-08-08: qty 200

## 2019-08-08 MED ORDER — ACETAMINOPHEN 500 MG PO TABS
ORAL_TABLET | ORAL | Status: AC
  Administered 2019-08-08: 1000 mg via ORAL
  Filled 2019-08-08: qty 2

## 2019-08-08 MED ORDER — MIDAZOLAM HCL 2 MG/2ML IJ SOLN
INTRAMUSCULAR | Status: AC
  Filled 2019-08-08: qty 2

## 2019-08-08 SURGICAL SUPPLY — 54 items
BNDG CMPR 9X4 STRL LF SNTH (GAUZE/BANDAGES/DRESSINGS) ×1
BNDG COHESIVE 4X5 TAN STRL (GAUZE/BANDAGES/DRESSINGS) ×3 IMPLANT
BNDG ELASTIC 3X5.8 VLCR STR LF (GAUZE/BANDAGES/DRESSINGS) ×3 IMPLANT
BNDG ELASTIC 4X5.8 VLCR STR LF (GAUZE/BANDAGES/DRESSINGS) ×7 IMPLANT
BNDG ESMARK 4X9 LF (GAUZE/BANDAGES/DRESSINGS) ×3 IMPLANT
BNDG GAUZE ELAST 4 BULKY (GAUZE/BANDAGES/DRESSINGS) ×7 IMPLANT
CABLE CERCLAGE W/NDL GRIP (Cable) ×2 IMPLANT
CORD BIPOLAR FORCEPS 12FT (ELECTRODE) ×3 IMPLANT
COVER MAYO STAND STRL (DRAPES) ×3 IMPLANT
COVER SURGICAL LIGHT HANDLE (MISCELLANEOUS) ×3 IMPLANT
COVER WAND RF STERILE (DRAPES) ×3 IMPLANT
CUFF TOURN SGL QUICK 18X4 (TOURNIQUET CUFF) ×3 IMPLANT
CUFF TOURN SGL QUICK 24 (TOURNIQUET CUFF)
CUFF TRNQT CYL 24X4X16.5-23 (TOURNIQUET CUFF) IMPLANT
DRAPE INCISE IOBAN 66X45 STRL (DRAPES) ×3 IMPLANT
DRAPE OEC MINIVIEW 54X84 (DRAPES) IMPLANT
DRSG ADAPTIC 3X8 NADH LF (GAUZE/BANDAGES/DRESSINGS) ×8 IMPLANT
EVACUATOR 1/8 PVC DRAIN (DRAIN) ×2 IMPLANT
GAUZE SPONGE 4X4 12PLY STRL (GAUZE/BANDAGES/DRESSINGS) ×3 IMPLANT
GAUZE SPONGE 4X4 12PLY STRL LF (GAUZE/BANDAGES/DRESSINGS) ×8 IMPLANT
GAUZE XEROFORM 1X8 LF (GAUZE/BANDAGES/DRESSINGS) ×3 IMPLANT
GAUZE XEROFORM 5X9 LF (GAUZE/BANDAGES/DRESSINGS) ×8 IMPLANT
GLOVE BIOGEL M 8.0 STRL (GLOVE) ×3 IMPLANT
GLOVE SS BIOGEL STRL SZ 8 (GLOVE) ×1 IMPLANT
GLOVE SUPERSENSE BIOGEL SZ 8 (GLOVE) ×2
GOWN STRL REUS W/ TWL LRG LVL3 (GOWN DISPOSABLE) ×2 IMPLANT
GOWN STRL REUS W/ TWL XL LVL3 (GOWN DISPOSABLE) ×3 IMPLANT
GOWN STRL REUS W/TWL LRG LVL3 (GOWN DISPOSABLE) ×6
GOWN STRL REUS W/TWL XL LVL3 (GOWN DISPOSABLE) ×9
KIT BASIN OR (CUSTOM PROCEDURE TRAY) ×3 IMPLANT
KIT TURNOVER KIT B (KITS) ×3 IMPLANT
LOOP VESSEL MAXI BLUE (MISCELLANEOUS) IMPLANT
MANIFOLD NEPTUNE II (INSTRUMENTS) ×3 IMPLANT
NDL HYPO 25GX1X1/2 BEV (NEEDLE) IMPLANT
NEEDLE HYPO 25GX1X1/2 BEV (NEEDLE) IMPLANT
NS IRRIG 1000ML POUR BTL (IV SOLUTION) ×3 IMPLANT
PACK ORTHO EXTREMITY (CUSTOM PROCEDURE TRAY) ×3 IMPLANT
PAD ARMBOARD 7.5X6 YLW CONV (MISCELLANEOUS) ×6 IMPLANT
PAD CAST 4YDX4 CTTN HI CHSV (CAST SUPPLIES) ×2 IMPLANT
PADDING CAST COTTON 4X4 STRL (CAST SUPPLIES) ×6
PIN GUIDE DRILL TIP 2.8X300 (DRILL) ×2 IMPLANT
SOL PREP POV-IOD 4OZ 10% (MISCELLANEOUS) ×9 IMPLANT
SPECIMEN JAR SMALL (MISCELLANEOUS) ×3 IMPLANT
SPLINT PLASTER CAST XFAST 5X30 (CAST SUPPLIES) IMPLANT
SPLINT PLASTER XFAST SET 5X30 (CAST SUPPLIES) ×2
SUT PROLENE 3 0 PS 2 (SUTURE) ×6 IMPLANT
SUT VIC AB 3-0 FS2 27 (SUTURE) ×5 IMPLANT
SYR CONTROL 10ML LL (SYRINGE) IMPLANT
TOWEL GREEN STERILE (TOWEL DISPOSABLE) ×6 IMPLANT
TOWEL GREEN STERILE FF (TOWEL DISPOSABLE) ×3 IMPLANT
TUBE CONNECTING 12'X1/4 (SUCTIONS)
TUBE CONNECTING 12X1/4 (SUCTIONS) IMPLANT
UNDERPAD 30X30 (UNDERPADS AND DIAPERS) ×3 IMPLANT
WATER STERILE IRR 1000ML POUR (IV SOLUTION) ×3 IMPLANT

## 2019-08-08 NOTE — Anesthesia Procedure Notes (Signed)
Anesthesia Regional Block: Supraclavicular block   Pre-Anesthetic Checklist: ,, timeout performed, Correct Patient, Correct Site, Correct Laterality, Correct Procedure, Correct Position, site marked, Risks and benefits discussed,  Surgical consent,  Pre-op evaluation,  At surgeon's request and post-op pain management  Laterality: Right  Prep: chloraprep       Needles:  Injection technique: Single-shot  Needle Type: Echogenic Stimulator Needle     Needle Length: 5cm  Needle Gauge: 22     Additional Needles:   Narrative:  Start time: 08/08/2019 9:52 AM End time: 08/08/2019 10:02 AM Injection made incrementally with aspirations every 5 mL.  Performed by: Personally  Anesthesiologist: Duane Boston, MD  Additional Notes: Functioning IV was confirmed and monitors applied.  A 43mm 22ga echogenic arrow stimulator was used. Sterile prep and drape,hand hygiene and sterile gloves were used.Ultrasound guidance: relevant anatomy identified, needle position confirmed, local anesthetic spread visualized around nerve(s)., vascular puncture avoided.  Image printed for medical record.  Negative aspiration and negative test dose prior to incremental administration of local anesthetic. The patient tolerated the procedure well.

## 2019-08-08 NOTE — Anesthesia Preprocedure Evaluation (Addendum)
Anesthesia Evaluation  Patient identified by MRN, date of birth, ID band  Reviewed: Allergy & Precautions, NPO status , Patient's Chart, lab work & pertinent test results  History of Anesthesia Complications Negative for: history of anesthetic complications  Airway Mallampati: II  TM Distance: >3 FB Neck ROM: Full    Dental no notable dental hx. (+) Dental Advisory Given   Pulmonary neg pulmonary ROS, former smoker,    Pulmonary exam normal        Cardiovascular negative cardio ROS Normal cardiovascular exam     Neuro/Psych negative neurological ROS     GI/Hepatic negative GI ROS, Neg liver ROS,   Endo/Other  negative endocrine ROS  Renal/GU negative Renal ROS     Musculoskeletal   Abdominal   Peds  Hematology negative hematology ROS (+)   Anesthesia Other Findings Day of surgery medications reviewed with the patient.  Reproductive/Obstetrics                            Anesthesia Physical Anesthesia Plan  ASA: II  Anesthesia Plan: Regional and MAC   Post-op Pain Management:    Induction:   PONV Risk Score and Plan: 2 and Ondansetron and Propofol infusion  Airway Management Planned: Natural Airway and Simple Face Mask  Additional Equipment:   Intra-op Plan:   Post-operative Plan:   Informed Consent: I have reviewed the patients History and Physical, chart, labs and discussed the procedure including the risks, benefits and alternatives for the proposed anesthesia with the patient or authorized representative who has indicated his/her understanding and acceptance.     Dental advisory given  Plan Discussed with: Anesthesiologist, CRNA and Surgeon  Anesthesia Plan Comments:        Anesthesia Quick Evaluation

## 2019-08-08 NOTE — H&P (Signed)
Diamond [name] is an 85 y.o. female.   Chief Complaint: Right elbow fracture displaced HPI: Patient presents for repair reconstruction right elbow fracture.  Patient has a significant right elbow fracture with displacement.  We will plan for ORIF and repair reconstruction is necessary.  Patient denies other issues or injuries.  Patient presents for evaluation and treatment of the of their upper extremity predicament. The patient denies neck, back, chest or  abdominal pain. The patient notes that they have no lower extremity problems. The patients primary complaint is noted. We are planning surgical care pathway for the upper extremity.  Past Medical History:  Diagnosis Date  . Fracture    right elbow  . Wears glasses     Past Surgical History:  Procedure Laterality Date  . BREAST SURGERY     benign fibroid   . CATARACT EXTRACTION, BILATERAL    . COLONOSCOPY W/ BIOPSIES AND POLYPECTOMY    . FRACTURE SURGERY     left  . PARTIAL HYSTERECTOMY      History reviewed. No pertinent family history. Social History:  reports that she has quit smoking. She has never used smokeless tobacco. She reports current alcohol use. She reports that she does not use drugs.  Allergies:  Allergies  Allergen Reactions  . Penicillins Swelling    Did it involve swelling of the face/tongue/throat, SOB, or low BP? No Did it involve sudden or severe rash/hives, skin peeling, or any reaction on the inside of your mouth or nose? No Did you need to seek medical attention at a hospital or doctor's office? No When did it last happen?10-15 YEARS If all above answers are "NO", may proceed with cephalosporin use.     Medications Prior to Admission  Medication Sig Dispense Refill  . Cholecalciferol (VITAMIN D3 PO) Take 1 tablet by mouth at bedtime.    . ciclopirox (PENLAC) 8 % solution Apply 1 application topically daily as needed.    Marland Kitchen HYDROcodone-acetaminophen (NORCO/VICODIN) 5-325 MG tablet Take 1-2  tablets by mouth every 4 (four) hours as needed (pain.).    Marland Kitchen naproxen sodium (ALEVE) 220 MG tablet Take 220-440 mg by mouth daily as needed (PAIN.).    Marland Kitchen sulfamethoxazole-trimethoprim (BACTRIM DS) 800-160 MG tablet Take 1 tablet by mouth 2 (two) times daily.    Marland Kitchen tretinoin (RETIN-A) 0.1 % cream Apply 1 application topically at bedtime.    . triamcinolone cream (KENALOG) 0.1 % Apply 1 application topically 2 (two) times daily as needed (skin rash/irritation).     Marland Kitchen desonide (DESOWEN) 0.05 % ointment Apply 1 application topically 2 (two) times daily as needed (skin rash/irritation).       Results for orders placed or performed during the hospital encounter of 08/07/19 (from the past 48 hour(s))  SARS CORONAVIRUS 2 (TAT 6-24 HRS) Nasopharyngeal Nasopharyngeal Swab     Status: None   Collection Time: 08/07/19 11:50 AM   Specimen: Nasopharyngeal Swab  Result Value Ref Range   SARS Coronavirus 2 NEGATIVE NEGATIVE    Comment: (NOTE) SARS-CoV-2 target nucleic acids are NOT DETECTED. The SARS-CoV-2 RNA is generally detectable in upper and lower respiratory specimens during the acute phase of infection. Negative results do not preclude SARS-CoV-2 infection, do not rule out co-infections with other pathogens, and should not be used as the sole basis for treatment or other patient management decisions. Negative results must be combined with clinical observations, patient history, and epidemiological information. The expected result is Negative. Fact Sheet for Patients: SugarRoll.be Fact Sheet for  Healthcare Providers: https://www.woods-mathews.com/ This test is not yet approved or cleared by the [place] and  has been authorized for detection and/or diagnosis of SARS-CoV-2 by FDA under an Emergency Use Authorization (EUA). This EUA will remain  in effect (meaning this test can be used) for the duration of the COVID-19 declaration under Section 56  4(b)(1) of the Act, 21 U.S.C. section 360bbb-3(b)(1), unless the authorization is terminated or revoked sooner. Performed at [facility], [place] [place], [place] [address]    DG Elbow Complete Right  Result Date: 08/06/2019 CLINICAL DATA:  Right elbow pain with swelling and bruising and decreased range of motion secondary to a fall this morning. EXAM: RIGHT ELBOW - COMPLETE 3+ VIEW COMPARISON:  None. FINDINGS: There is a distracted fracture of the olecranon process of the proximal ulna. The distraction is approximately 2 cm. Prominent soft tissue swelling posteriorly at the fracture site. Distal humerus and radius are intact. No dislocation. Hemarthrosis. Calcific tendinopathy at the origin of the common extensor tendon at the lateral epicondyle of the distal humerus. IMPRESSION: 1. Acute distracted fracture of the olecranon process of the proximal ulna. 2. Hemarthrosis. Electronically Signed   By: Lorriane Shire M.D.   On: 08/06/2019 12:59    Review of Systems  Blood pressure (!) 154/67, pulse 90, temperature 98.5 F (36.9 C), temperature source Oral, resp. rate 17, height 5\' 3"  (1.6 m), weight 59 kg, SpO2 96 %. Physical Exam  Fracture left elbow with marked swelling and dysfunction.  Patient is sensate in her fingertips and has refill with a intact vascular exam.  There is no evidence of obvious compartment syndrome at this juncture.  Opposite extremity is neurovascularly intact. The patient is alert and oriented in no acute distress. The patient complains of pain in the affected upper extremity.  The patient is noted to have a normal HEENT exam. Lung fields show equal chest expansion and no shortness of breath. Abdomen exam is nontender without distention. Lower extremity examination does not show any fracture dislocation or blood clot symptoms. Pelvis is stable and the neck and back are stable and nontender.She is generally healthy. Assessment/Plan We will plan for  open reduction internal fixation right elbow fracture with ulnar nerve release in situ is necessary and repair reconstruction.  All questions have been addressed.  We are planning surgery for your upper extremity. The risk and benefits of surgery to include risk of bleeding, infection, anesthesia,  damage to normal structures and failure of the surgery to accomplish its intended goals of relieving symptoms and restoring function have been discussed in detail. With this in mind we plan to proceed. I have specifically discussed with the patient the pre-and postoperative regime and the dos and don'ts and risk and benefits in great detail. Risk and benefits of surgery also include risk of dystrophy(CRPS), chronic nerve pain, failure of the healing process to go onto completion and other inherent risks of surgery The relavent the pathophysiology of the disease/injury process, as well as the alternatives for treatment and postoperative course of action has been discussed in great detail with the patient who desires to proceed.  We will do everything in our power to help you (the patient) restore function to the upper extremity. It is a pleasure to see this patient today.   Willa Frater III, MD 08/08/2019, 10:36 AM

## 2019-08-08 NOTE — Op Note (Signed)
Operative note August 08, 2019  Diamond Kaufman MD  Preoperative diagnosis comminuted olecranon fracture right upper extremity with significant swelling.  Postop diagnosis the same  Procedure: #1 open reduction internal fixation with K wire and tension band construct right comminuted olecranon fracture #2 5 view radiographic series right elbow #3 ulnar nerve release in situ  Diamond [name]  Anesthesia block with IV sedation  Estimated blood loss minimal  Drains 1  Tourniquet time 47 minutes  Operation detail: Patient was seen by myself and anesthesia taken to the operative theater and underwent smooth induction of IV sedation we carefully turned the patient in a sloppy lateral position with axillary roll and sequential compression devices placed.  Timeout was observed I prepped the arm with Hibiclens scrub x2 followed by Betadine scrub and paint which I did personally.  Following this sterile field was secured Ioban was placed and the patient underwent an incision.  I incorporated the skin tear and excised this about the posterior elbow region.  Dissection was carried down with full-thickness flaps to the bony architecture hematoma was removed from the joint.  At this time I performed a ulnar nerve release in situ about the arcade of Struthers medial intermuscular septum 2 heads of the FCU Osborne's ligament and of course the cubital tunnel itself.  The nerve was hyperemic intact and slightly contused I mobilized the skin tissue for excellent closure purposes.  Following this we set out for fracture fixation.  We very carefully reduce the fracture after debriding the area.  The patient was comminuted however I felt that Kirschner wire and tension band fixation would be in her best interest to eliminate the need for a bulky plate over very thin posterior skin  Reduction was placed checked under radiograph to 2.0 K wires were placed proximal radial ulnar joint looked excellent and at this time  I then placed a transverse drill path through the ulna greater than 2 diameters distal to the fracture site.  Biomet cable was threaded through this and following this it was placed in figure-of-eight fashion around the K wires.  The K wires were checked as was the tension band.  The area was reduced and tension to my satisfaction without difficulty.  The patient tolerated this well.  AP lateral oblique x-rays looked excellent the K wires were then prebent seated and placed.  I checked the motion once again and all looked well.  This was a tension band construct with K wire.  I sutured over the triceps.  The patient then underwent irrigation of 3 L followed by closure of the periosteal and fascial tissue over the cable to try and prevent irritation.  Following this we irrigated additionally checked the ulnar nerve I additional time and made final copy x-rays.  All looked excellent.  The patient has smooth arc of motion and no complications.  Once this was complete we then closed the patient under no tension about the skin with vertical mattress and simple 3-0 Prolene sutures.  Hemovac drain was placed.  Compartments were soft.  In general she has very thin skin and thus I placed Adaptic Xeroform about the forearm wrist and hand construct and placed a very bulky bandage.  A posterior splint was applied making sure there was minimal tension against any skin contours.  I was very careful with the skin as I have great respect for the thin skin in this population of patients.  I discussed all issues with her family.  We will remove the  drain tomorrow and ask her to notify me should any problems occur.  I will see her back in 16 days for suture removal and therapy for a posterior type splint without any of the splint on the incision site due to her thin skin issues.  All questions have been addressed.  Diamond Tagg MD

## 2019-08-08 NOTE — Discharge Instructions (Signed)
Please elevate and massage her fingers frequently to decrease edema and swelling.  Once her block wears off you will have quite a bit of pain about the elbow.  Your pain medicine will help with this.  Please make sure to take a stool softener. Please remove the drain tomorrow at noon. Please do not get your bandage wet.  Dr. Amedeo Plenty will see you in 16 days for suture removal and therapy.  Your operation went splendidly.  Please call us for any problems.  Please see below instructions additionally.Keep bandage clean and dry.  Call for any problems.  No smoking.  Criteria for driving a car: you should be off your pain medicine for 7-8 hours, able to drive one handed(confident), thinking clearly and feeling able in your judgement to drive. Continue elevation as it will decrease swelling.  If instructed by MD move your fingers within the confines of the bandage/splint.  Use ice if instructed by your MD. Call immediately for any sudden loss of feeling in your hand/arm or change in functional abilities of the extremity.We recommend that you to take vitamin C 1000 mg a day to promote healing. We also recommend that if you require  pain medicine that you take a stool softener to prevent constipation as most pain medicines will have constipation side effects. We recommend either Peri-Colace or Senokot and recommend that you also consider adding MiraLAX as well to prevent the constipation affects from pain medicine if you are required to use them. These medicines are over the counter and may be purchased at a local pharmacy. A cup of yogurt and a probiotic can also be helpful during the recovery process as the medicines can disrupt your intestinal environment.

## 2019-08-08 NOTE — Anesthesia Procedure Notes (Signed)
Procedure Name: MAC Date/Time: 08/08/2019 10:59 AM Performed by: Kyung Rudd, CRNA Pre-anesthesia Checklist: Patient identified, Emergency Drugs available, Suction available and Patient being monitored Patient Re-evaluated:Patient Re-evaluated prior to induction Oxygen Delivery Method: Simple face mask Induction Type: IV induction Placement Confirmation: positive ETCO2 Dental Injury: Teeth and Oropharynx as per pre-operative assessment

## 2019-08-08 NOTE — Transfer of Care (Signed)
Immediate Anesthesia Transfer of Care Note  Patient: Diamond [name]  Procedure(s) Performed: OPEN REDUCTION INTERNAL FIXATION (ORIF) DISPLACED RIGHT ELBOW/OLECRANON FRACTURE WITH ULNAR NERVE RELEASE (Right Elbow)  Patient Location: PACU  Anesthesia Type:MAC combined with regional for post-op pain  Level of Consciousness: awake, alert  and oriented  Airway & Oxygen Therapy: Patient Spontanous Breathing  Post-op Assessment: Report given to RN and Post -op Vital signs reviewed and stable  Post vital signs: Reviewed and stable  Last Vitals:  Vitals Value Taken Time  BP 127/71 08/08/19 1256  Temp 36.3 C 08/08/19 1255  Pulse 71 08/08/19 1258  Resp 22 08/08/19 1258  SpO2 97 % 08/08/19 1258  Vitals shown include unvalidated device data.  Last Pain:  Vitals:   08/08/19 1255  TempSrc:   PainSc: 0-No pain      Patients Stated Pain Goal: 3 (28/31/51 7616)  Complications: No apparent anesthesia complications

## 2019-08-08 NOTE — Anesthesia Postprocedure Evaluation (Signed)
Anesthesia Post Note  Patient: Diamond [name]  Procedure(s) Performed: OPEN REDUCTION INTERNAL FIXATION (ORIF) DISPLACED RIGHT ELBOW/OLECRANON FRACTURE WITH ULNAR NERVE RELEASE (Right Elbow)     Patient location during evaluation: PACU Anesthesia Type: Regional Level of consciousness: awake Pain management: pain level controlled Respiratory status: spontaneous breathing Cardiovascular status: stable Postop Assessment: no apparent nausea or vomiting Anesthetic complications: no    Last Vitals:  Vitals:   08/08/19 1310 08/08/19 1325  BP: 94/81 128/71  Pulse: 66 65  Resp: 20 20  Temp:    SpO2: 96% 98%    Last Pain:  Vitals:   08/08/19 1325  TempSrc:   PainSc: 0-No pain                 Areyana Leoni

## 2019-08-10 ENCOUNTER — Encounter: Payer: Self-pay | Admitting: *Deleted

## 2019-08-12 ENCOUNTER — Ambulatory Visit: Payer: Medicare Other | Attending: Internal Medicine

## 2019-08-12 DIAGNOSIS — Z23 Encounter for immunization: Secondary | ICD-10-CM | POA: Insufficient documentation

## 2019-08-12 NOTE — Progress Notes (Signed)
   Covid-19 Vaccination Clinic  Name:  Diamond [name]    MRN: [id number] DOB: 02/13/1935  08/12/2019  Ms. [name] was observed post Covid-19 immunization for 15 minutes without incidence. She was provided with Vaccine Information Sheet and instruction to access the V-Safe system.   Ms. [name] was instructed to call 911 with any severe reactions post vaccine: Marland Kitchen Difficulty breathing  . Swelling of your face and throat  . A fast heartbeat  . A bad rash all over your body  . Dizziness and weakness    Immunizations Administered    Name Date Dose VIS Date Route   Pfizer COVID-19 Vaccine 08/12/2019 10:29 AM 0.3 mL 07/10/2019 Intramuscular   Manufacturer: [place]   Lot: [id number]   [place]: [id number]

## 2019-09-01 ENCOUNTER — Ambulatory Visit: Payer: Medicare Other | Attending: Internal Medicine

## 2019-09-01 DIAGNOSIS — Z23 Encounter for immunization: Secondary | ICD-10-CM | POA: Insufficient documentation

## 2019-09-01 NOTE — Progress Notes (Signed)
   Covid-19 Vaccination Clinic  Name:  Diamond [name]    MRN: [id number] DOB: March 22, 1935  09/01/2019  Diamond [name] was observed post Covid-19 immunization for 15 minutes without incidence. She was provided with Vaccine Information Sheet and instruction to access the V-Safe system.   Diamond [name] was instructed to call 911 with any severe reactions post vaccine: Marland Kitchen Difficulty breathing  . Swelling of your face and throat  . A fast heartbeat  . A bad rash all over your body  . Dizziness and weakness    Immunizations Administered    Name Date Dose VIS Date Route   Pfizer COVID-19 Vaccine 09/01/2019 10:17 AM 0.3 mL 07/10/2019 Intramuscular   Manufacturer: [place]   Lot: [id number]   [place]: [id number]

## 2019-11-27 ENCOUNTER — Other Ambulatory Visit: Payer: Self-pay | Admitting: Internal Medicine

## 2019-11-27 DIAGNOSIS — Z1231 Encounter for screening mammogram for malignant neoplasm of breast: Secondary | ICD-10-CM

## 2019-12-15 DIAGNOSIS — M1711 Unilateral primary osteoarthritis, right knee: Secondary | ICD-10-CM | POA: Insufficient documentation

## 2020-01-01 ENCOUNTER — Other Ambulatory Visit: Payer: Self-pay

## 2020-01-01 ENCOUNTER — Ambulatory Visit
Admission: RE | Admit: 2020-01-01 | Discharge: 2020-01-01 | Disposition: A | Discharge status: Home / Self Care | Payer: Medicare Other | Source: Ambulatory Visit | Attending: Internal Medicine | Admitting: Internal Medicine

## 2020-01-01 DIAGNOSIS — Z1231 Encounter for screening mammogram for malignant neoplasm of breast: Secondary | ICD-10-CM

## 2020-11-22 ENCOUNTER — Other Ambulatory Visit: Payer: Self-pay | Admitting: Internal Medicine

## 2020-11-22 DIAGNOSIS — Z1231 Encounter for screening mammogram for malignant neoplasm of breast: Secondary | ICD-10-CM

## 2021-01-07 IMAGING — DX DG ELBOW COMPLETE 3+V*R*
4 series · 4 of 4 positions shown · non-contrast
Comparison: None.

CLINICAL DATA: Right elbow pain with swelling and bruising and
decreased range of motion secondary to a fall this morning.

EXAM:
RIGHT ELBOW - COMPLETE 3+ VIEW

[elbow ap]
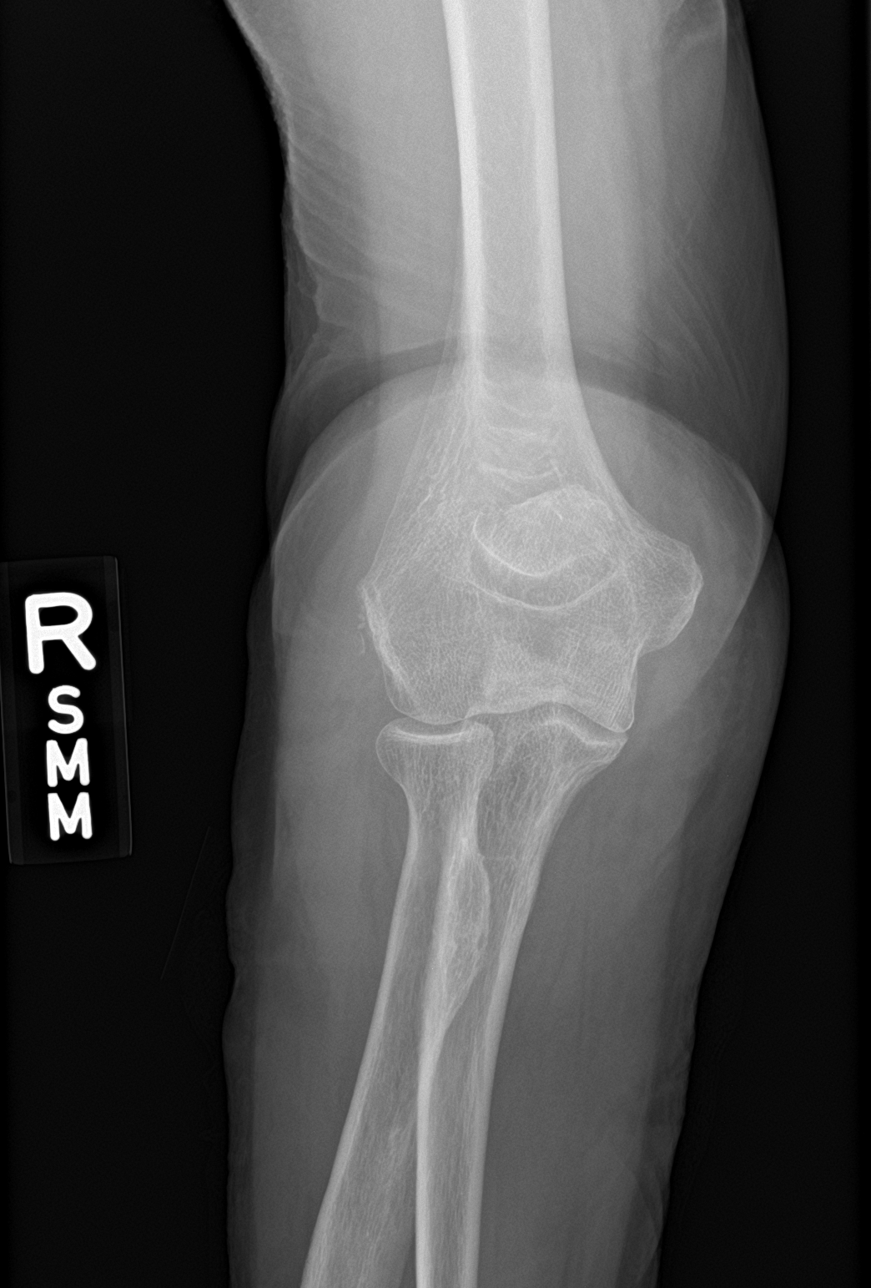

[elbow obl (1 of 2)]
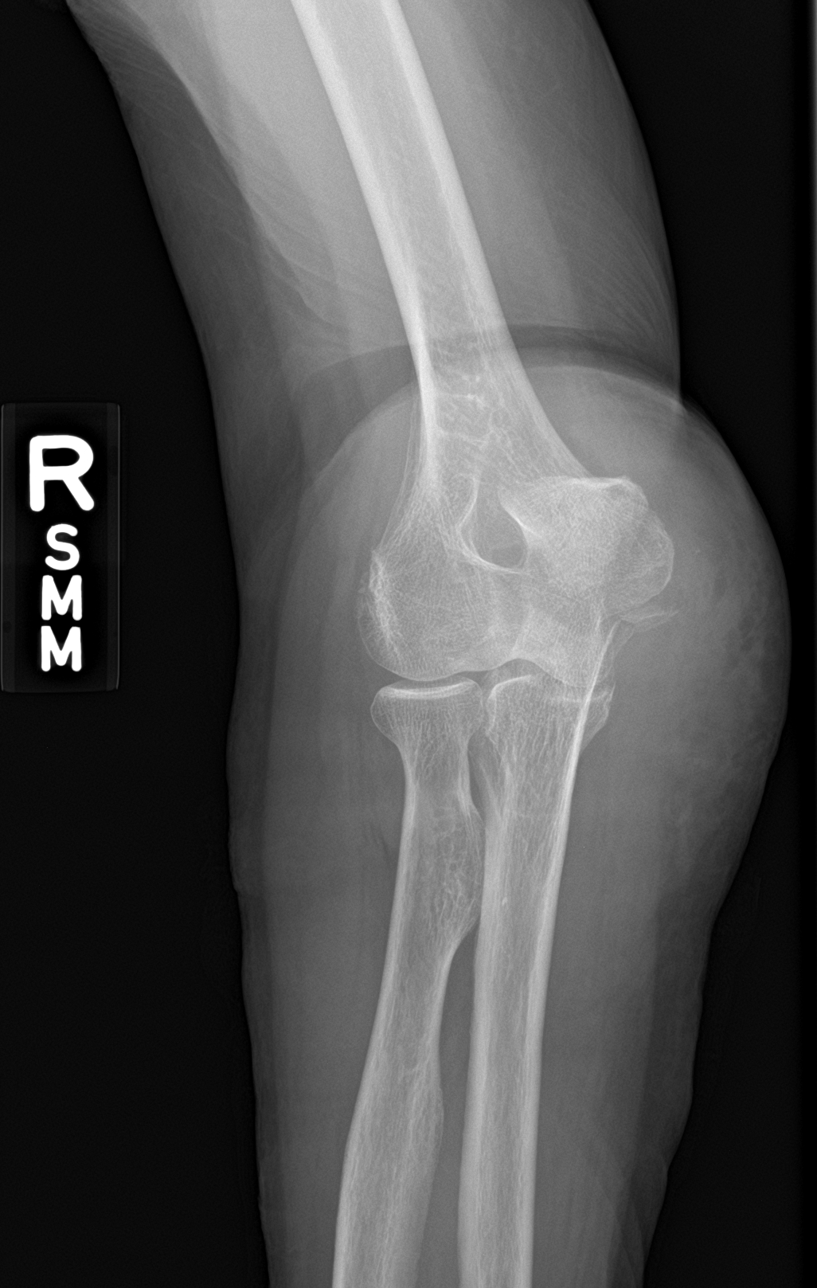

[elbow obl (2 of 2)]
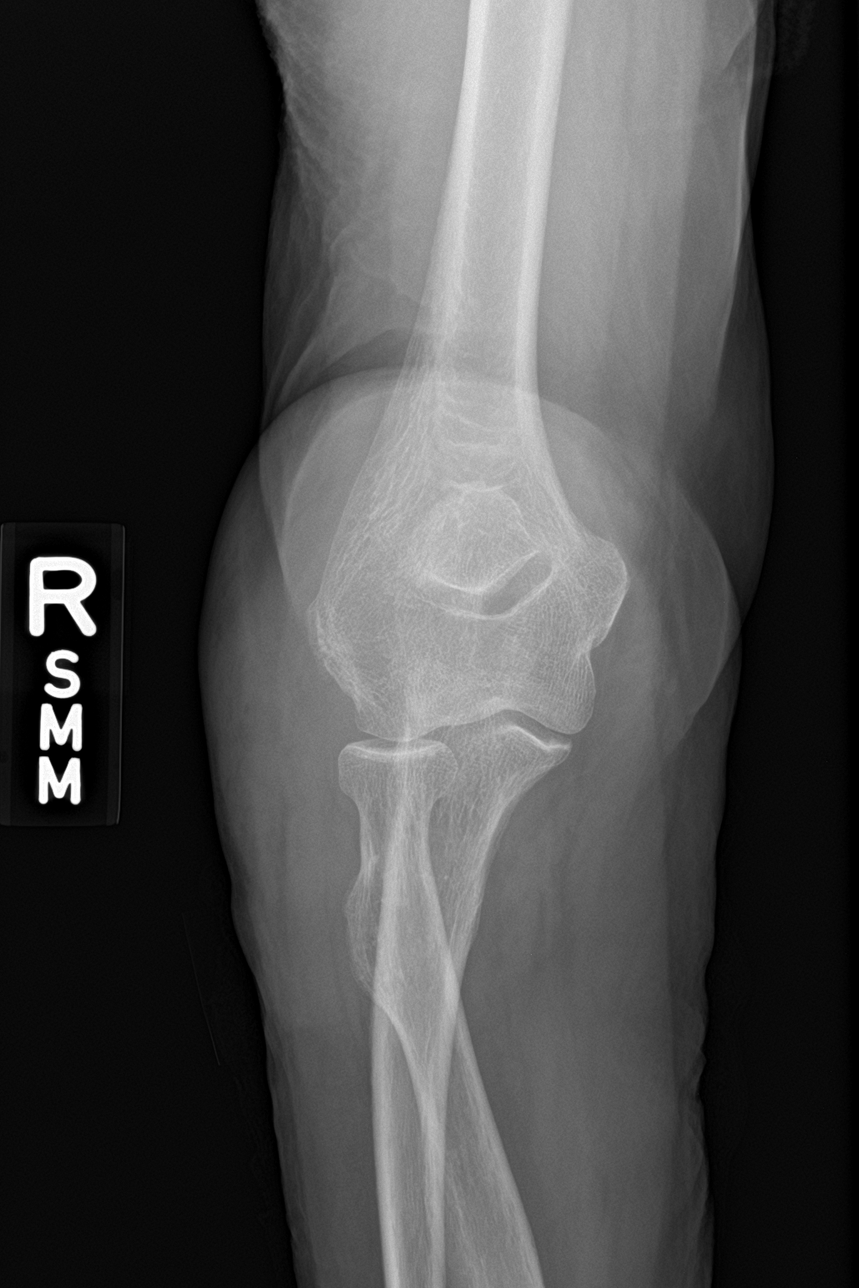

[elbow lat]
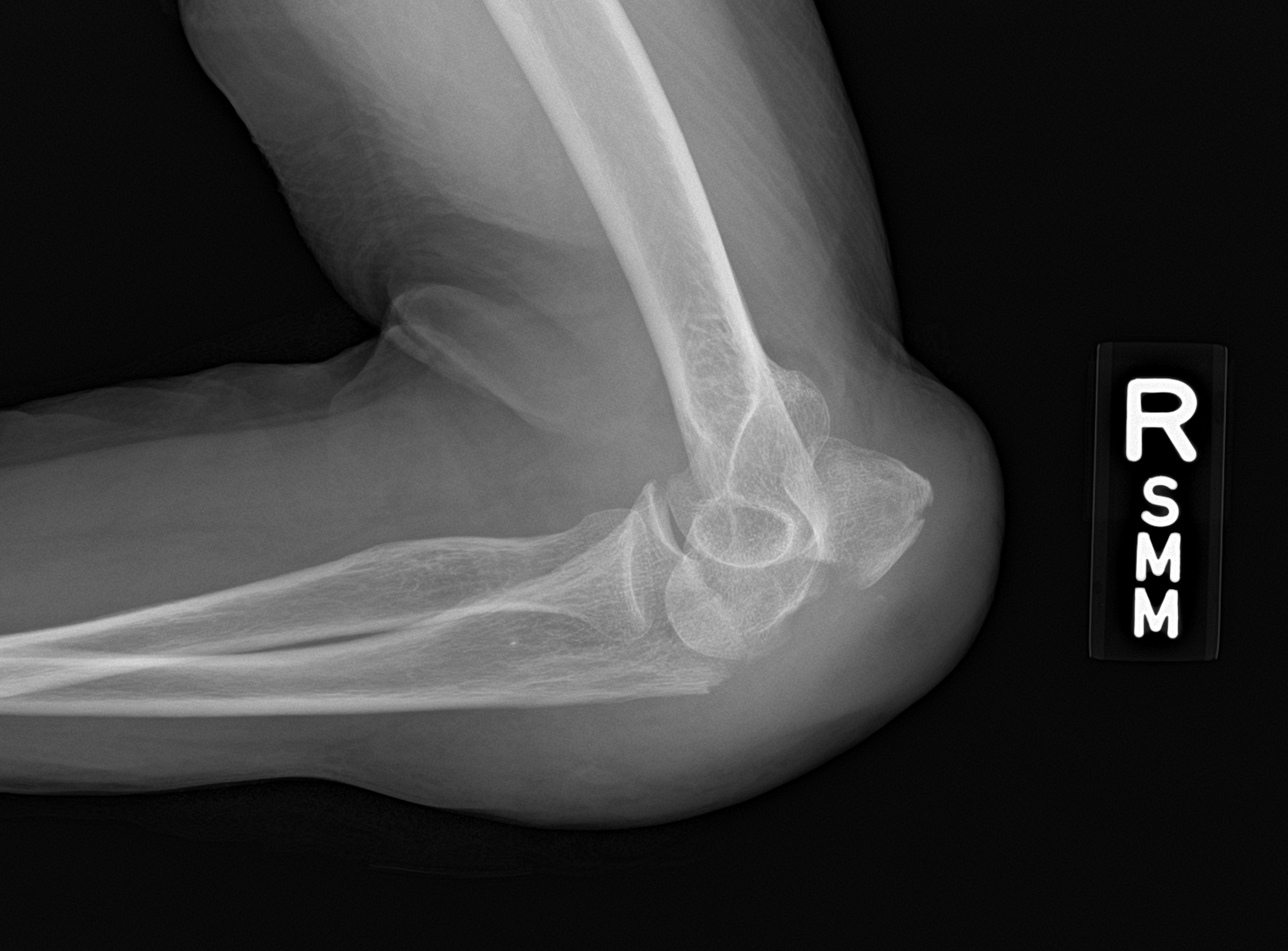

[4 of 4 positions shown; findings below may reference images not displayed]

FINDINGS: There is a distracted fracture of the olecranon process of the
proximal ulna. The distraction is approximately 2 cm. Prominent soft
tissue swelling posteriorly at the fracture site. Distal humerus and
radius are intact. No dislocation. Hemarthrosis.

Calcific tendinopathy at the origin of the common extensor tendon at
the lateral epicondyle of the distal humerus.
IMPRESSION: 1. Acute distracted fracture of the olecranon process of the
proximal ulna.
2. Hemarthrosis.

## 2021-01-11 ENCOUNTER — Other Ambulatory Visit: Payer: Self-pay

## 2021-01-11 ENCOUNTER — Ambulatory Visit
Admission: RE | Admit: 2021-01-11 | Discharge: 2021-01-11 | Disposition: A | Discharge status: Home / Self Care | Payer: Medicare Other | Source: Ambulatory Visit | Attending: Internal Medicine | Admitting: Internal Medicine

## 2021-01-11 DIAGNOSIS — Z1231 Encounter for screening mammogram for malignant neoplasm of breast: Secondary | ICD-10-CM

## 2021-12-11 ENCOUNTER — Other Ambulatory Visit: Payer: Self-pay | Admitting: Internal Medicine

## 2021-12-11 DIAGNOSIS — Z1231 Encounter for screening mammogram for malignant neoplasm of breast: Secondary | ICD-10-CM

## 2022-01-12 ENCOUNTER — Ambulatory Visit
Admission: RE | Admit: 2022-01-12 | Discharge: 2022-01-12 | Disposition: A | Discharge status: Home / Self Care | Payer: Medicare Other | Source: Ambulatory Visit | Attending: Internal Medicine | Admitting: Internal Medicine

## 2022-01-12 DIAGNOSIS — Z1231 Encounter for screening mammogram for malignant neoplasm of breast: Secondary | ICD-10-CM

## 2022-01-15 ENCOUNTER — Other Ambulatory Visit: Payer: Self-pay | Admitting: Internal Medicine

## 2022-01-15 DIAGNOSIS — N6489 Other specified disorders of breast: Secondary | ICD-10-CM

## 2022-01-23 ENCOUNTER — Other Ambulatory Visit: Payer: Self-pay | Admitting: Internal Medicine

## 2022-01-23 ENCOUNTER — Ambulatory Visit
Admission: RE | Admit: 2022-01-23 | Discharge: 2022-01-23 | Disposition: A | Discharge status: Home / Self Care | Payer: Medicare Other | Source: Ambulatory Visit | Attending: Internal Medicine | Admitting: Internal Medicine

## 2022-01-23 ENCOUNTER — Ambulatory Visit: Payer: Medicare Other

## 2022-01-23 DIAGNOSIS — N6489 Other specified disorders of breast: Secondary | ICD-10-CM

## 2022-01-27 DIAGNOSIS — C801 Malignant (primary) neoplasm, unspecified: Secondary | ICD-10-CM

## 2022-01-27 HISTORY — DX: Malignant (primary) neoplasm, unspecified: C80.1

## 2022-02-02 ENCOUNTER — Ambulatory Visit
Admission: RE | Admit: 2022-02-02 | Discharge: 2022-02-02 | Disposition: A | Discharge status: Home / Self Care | Payer: Medicare Other | Source: Ambulatory Visit | Attending: Internal Medicine | Admitting: Internal Medicine

## 2022-02-02 DIAGNOSIS — N6489 Other specified disorders of breast: Secondary | ICD-10-CM

## 2022-02-02 HISTORY — PX: BREAST BIOPSY: SHX20

## 2022-02-06 ENCOUNTER — Telehealth: Payer: Self-pay | Admitting: Hematology and Oncology

## 2022-02-06 ENCOUNTER — Other Ambulatory Visit: Payer: Medicare Other

## 2022-02-06 NOTE — Telephone Encounter (Signed)
Scheduled appt per 7/10 staff msg from navigator. Pt is aware of appt date and time. Pt is aware to arrive 15 mins prior to appt time and to bring and updated insurance card. Pt is aware of appt location.

## 2022-02-07 ENCOUNTER — Encounter: Payer: Self-pay | Admitting: *Deleted

## 2022-02-07 ENCOUNTER — Other Ambulatory Visit: Payer: Self-pay

## 2022-02-07 ENCOUNTER — Telehealth: Payer: Self-pay | Admitting: Hematology and Oncology

## 2022-02-07 ENCOUNTER — Encounter (HOSPITAL_BASED_OUTPATIENT_CLINIC_OR_DEPARTMENT_OTHER): Payer: Self-pay | Admitting: General Surgery

## 2022-02-07 ENCOUNTER — Other Ambulatory Visit: Payer: Self-pay | Admitting: General Surgery

## 2022-02-07 ENCOUNTER — Inpatient Hospital Stay: Payer: Medicare Other | Attending: Hematology and Oncology | Admitting: Hematology and Oncology

## 2022-02-07 DIAGNOSIS — Z170 Estrogen receptor positive status [ER+]: Secondary | ICD-10-CM | POA: Diagnosis not present

## 2022-02-07 DIAGNOSIS — C50512 Malignant neoplasm of lower-outer quadrant of left female breast: Secondary | ICD-10-CM

## 2022-02-07 DIAGNOSIS — I10 Essential (primary) hypertension: Secondary | ICD-10-CM | POA: Diagnosis not present

## 2022-02-07 DIAGNOSIS — C50312 Malignant neoplasm of lower-inner quadrant of left female breast: Secondary | ICD-10-CM

## 2022-02-07 DIAGNOSIS — Z87891 Personal history of nicotine dependence: Secondary | ICD-10-CM | POA: Diagnosis not present

## 2022-02-07 NOTE — Research (Signed)
Trial:  [employer] 2021-05 - Specimen Collection Study to Evaluate Biomarkers in Subjects with Cancer   Patient Diamond [name] was identified by Dr. Lindi Adie as a potential candidate for the above listed study.  This Clinical Research Nurse met with Diamond [name], [id number], on 02/07/22 in a manner and location that ensures patient privacy to discuss participation in the above listed research study.  Patient is Accompanied by her daughter, Diamond [name] .  A copy of the informed consent document with embedded HIPAA language was provided to the patient.  Patient reads, speaks, and understands [place].   Patient was provided with the business card of this Nurse and encouraged to contact the research team with any questions.  Approximately 10 minutes were spent with the patient reviewing the informed consent documents.  Patient was provided the option of taking informed consent documents home to review and was encouraged to review at their convenience with their support network, including other care providers. Patient took the consent documents home to review.  Foye Spurling, BSN, RN, [place] Nurse II 02/07/2022

## 2022-02-07 NOTE — Assessment & Plan Note (Signed)
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 After reviewing the pathology in detail, we proceeded to discuss the different treatment options between surgery, radiation, chemotherapy, antiestrogen therapies.  Treatment plan: 1.  Breast conserving surgery 2. adjuvant antiestrogen therapy  Return to clinic after surgery to discuss final pathology report and then antiestrogen treatment plan

## 2022-02-07 NOTE — Progress Notes (Signed)
[facility] CONSULT NOTE  Patient Care Team: [name], MD as PCP - General (Internal Medicine)  CHIEF COMPLAINTS/PURPOSE OF CONSULTATION:  Newly diagnosed breast cancer  HISTORY OF PRESENTING ILLNESS:  Diamond [name] 87 y.o. female is here because of recent diagnosis of left breast cancer. She presents to the clinic for a consult.  She had a routine screening mammogram that detected a subtle distortion in the left breast which was further evaluated by ultrasound and a biopsy.  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  She is here today to discuss treatment options.  I reviewed her records extensively and collaborated the history with the patient.  SUMMARY OF ONCOLOGIC HISTORY: Oncology History  Malignant neoplasm of lower-outer quadrant of left breast of female, estrogen receptor positive ([place])  02/02/2022 Initial Diagnosis   Screening mammogram detected subtle architectural distortion lower left breast (posterior to the site of previous surgical excision): Grade 1 IDC with DCIS ER 100%, PR 10%, Ki-67 less than 5%, HER2 1+ negative   02/07/2022 Cancer Staging   Staging form: Breast, AJCC 8th Edition - Clinical: Stage IA (cT1, cN0, cM0, G1, ER+, PR+, HER2-) - Signed by Nicholas Lose, MD on 02/07/2022 Stage prefix: Initial diagnosis Histologic grading system: 3 grade system      MEDICAL HISTORY:  Past Medical History:  Diagnosis Date   Arthritis    knees   Cancer ([facility]) 01/2022   left breast IDC w/ DCIS   Fracture    right elbow   Hypertension    Wears glasses     SURGICAL HISTORY: Past Surgical History:  Procedure Laterality Date   BREAST SURGERY     benign fibroid    CATARACT EXTRACTION, BILATERAL     COLONOSCOPY W/ BIOPSIES AND POLYPECTOMY     FRACTURE SURGERY     left   ORIF ELBOW FRACTURE Right 08/08/2019   Procedure: OPEN REDUCTION INTERNAL FIXATION (ORIF)  DISPLACED RIGHT ELBOW/OLECRANON FRACTURE WITH ULNAR NERVE RELEASE;  Surgeon: Roseanne Kaufman, MD;  Location: [facility];  Service: Orthopedics;  Laterality: Right;  90 MINS BLOCK WITH IV SED   PARTIAL HYSTERECTOMY      SOCIAL HISTORY: Social History   Socioeconomic History   Marital status: Widowed    Spouse name: Not on file   Number of children: Not on file   Years of education: Not on file   Highest education level: Not on file  Occupational History   Not on file  Tobacco Use   Smoking status: Former   Smokeless tobacco: Never   Tobacco comments:    " quit smoking cigarettes in 1987 "  Vaping Use   Vaping Use: Never used  Substance and Sexual Activity   Alcohol use: Yes    Comment: 3-4 times a week wine   Drug use: Never   Sexual activity: Not Currently    Birth control/protection: Surgical    Comment: hyst  Other Topics Concern   Not on file  Social History Narrative   Not on file   Social Determinants of Health   Financial Resource Strain: Not on file  Food Insecurity: Not on file  Transportation Needs: Not on file  Physical Activity: Not on file  Stress: Not on file  Social Connections: Not on file  Intimate Partner Violence: Not on file    FAMILY HISTORY: Family History  Problem Relation Age of Onset   Breast cancer Daughter  ALLERGIES:  is allergic to penicillins.  MEDICATIONS:  Current Outpatient Medications  Medication Sig Dispense Refill   Cholecalciferol (VITAMIN D3 PO) Take 1 tablet by mouth at bedtime.     naproxen sodium (ALEVE) 220 MG tablet Take 220-440 mg by mouth daily as needed (PAIN.).     olmesartan (BENICAR) 20 MG tablet Take 20 mg by mouth daily.     Probiotic Product (ALIGN) 4 MG CAPS Take by mouth.     tretinoin (RETIN-A) 0.1 % cream Apply 1 application topically at bedtime.     No current facility-administered medications for this visit.    REVIEW OF SYSTEMS:   Constitutional: Denies fevers, chills or abnormal night  sweats Breast:  Denies any palpable lumps or discharge All other systems were reviewed with the patient and are negative.  PHYSICAL EXAMINATION: ECOG PERFORMANCE STATUS: 1 - Symptomatic but completely ambulatory  Vitals:   02/07/22 1455  BP: (!) 149/74  Pulse: 83  Resp: 18  Temp: (!) 97.5 F (36.4 C)  SpO2: 94%   Filed Weights   02/07/22 1455  Weight: 123 lb 8 oz (56 kg)    GENERAL:alert, no distress and comfortable BREAST: No palpable nodules in breast. No palpable axillary or supraclavicular lymphadenopathy (exam performed in the presence of a chaperone)   RADIOGRAPHIC STUDIES: I have personally reviewed the radiological reports and agreed with the findings in the report.  ASSESSMENT AND PLAN:  Malignant neoplasm of lower-outer quadrant of left breast of female, estrogen receptor positive ([place]) 02/02/2022: Screening mammogram detected subtle architectural distortion lower left breast (posterior to the site of previous surgical excision) grade 1 IDC with DCIS ER 100%, PR 10%, Ki-67 less than 5%, HER2 1+ negative  Pathology and radiology counseling: Discussed with the patient, the details of pathology including the type of breast cancer,the clinical staging, the significance of ER, PR and HER-2/neu receptors and the implications for treatment. After reviewing the pathology in detail, we proceeded to discuss the different treatment options between surgery, radiation, chemotherapy, antiestrogen therapies.  Treatment plan: 1.  Breast conserving surgery 2. adjuvant antiestrogen therapy  Return to clinic after surgery to discuss final pathology report and then antiestrogen treatment plan   All questions were answered. The patient knows to call the clinic with any problems, questions or concerns.    [name], MD 02/07/22  I Gardiner Coins am scribing for Dr. Lindi Adie  I have reviewed the above documentation for accuracy and completeness, and I agree with the above.

## 2022-02-07 NOTE — Telephone Encounter (Signed)
Scheduled appointment per 7/12 los. Left voicemail.

## 2022-02-08 ENCOUNTER — Telehealth: Payer: Self-pay | Admitting: *Deleted

## 2022-02-08 ENCOUNTER — Encounter (HOSPITAL_BASED_OUTPATIENT_CLINIC_OR_DEPARTMENT_OTHER)
Admission: RE | Admit: 2022-02-08 | Discharge: 2022-02-08 | Disposition: A | Discharge status: Home / Self Care | Payer: Medicare Other | Source: Ambulatory Visit | Attending: General Surgery | Admitting: General Surgery

## 2022-02-08 ENCOUNTER — Other Ambulatory Visit: Payer: Self-pay

## 2022-02-08 DIAGNOSIS — Z170 Estrogen receptor positive status [ER+]: Secondary | ICD-10-CM | POA: Diagnosis not present

## 2022-02-08 DIAGNOSIS — Z01818 Encounter for other preprocedural examination: Secondary | ICD-10-CM | POA: Insufficient documentation

## 2022-02-08 DIAGNOSIS — C50512 Malignant neoplasm of lower-outer quadrant of left female breast: Secondary | ICD-10-CM

## 2022-02-08 MED ORDER — ENSURE PRE-SURGERY PO LIQD: 296.0000 mL | Freq: Once | ORAL | Status: DC

## 2022-02-08 NOTE — Progress Notes (Signed)

## 2022-02-08 NOTE — Telephone Encounter (Signed)
[id number]:  Patient states she would like to participate in the study and she can come in tomorrow morning at 9am for consent visit and lab.  Thanked patient for her participation.  Instructed her to check in at front desk tomorrow and research nurse will come get her for the visit. Encouraged her to call back if any questions. She denies any questions at this time.  Foye Spurling, BSN, RN, [place] Nurse II 02/08/2022 1:37 PM

## 2022-02-09 ENCOUNTER — Inpatient Hospital Stay: Payer: Medicare Other

## 2022-02-09 ENCOUNTER — Other Ambulatory Visit: Payer: Self-pay

## 2022-02-09 DIAGNOSIS — Z170 Estrogen receptor positive status [ER+]: Secondary | ICD-10-CM

## 2022-02-09 LAB — RESEARCH LABS

## 2022-02-09 NOTE — Research (Signed)
[employer] 2021-05 - Specimen Collection Study to Evaluate Biomarkers in Subjects with Cancer    This Nurse has reviewed this patient's inclusion and exclusion criteria and confirmed Diamond [name] is eligible for study participation.  Patient will continue with enrollment.  Eligibility confirmed by treating investigator, who also agrees that patient should proceed with enrollment.   Medical History:  High Blood Pressure  Yes Coronary Artery Disease No Lupus    No Rheumatoid Arthritis  No Diabetes   No      Lynch Syndrome  No  Is the patient currently taking a magnesium supplement?   No  Does the patient have a personal history of cancer (greater than 5 years ago)?  Yes If yes, Cancer type and date of diagnosis?   Squamous cell  skin cancers Has this previous diagnosis been treated? Yes       If so, treatment type? Excision    Does the patient have a family history of cancer in 1st or 2nd degree relatives? Yes If yes, Relationship(s) and Cancer type(s)? Mother had lymphoma, daughter was treated for breast ca  Does the patient have history of alcohol consumption? Yes   If yes, current or former? current Number of years? 10 yr Drinks per week? 4-5  Does the patient have history of cigarette, cigar, pipe, or chewing tobacco use?  Yes  If yes, current for former? Former If yes, type (Cigarette, cigar, pipe, and/or chewing tobacco)? Cigarettes   If former, year stopped? 1983 Number of years? 20 Packs/number/containers per day? 1  Cooper Moroney G. Daiki Dicostanzo, RN, BSN, [facility] She  [facility] Clinical Research Nurse [place] [phone]  Pager [phone] 02/09/2022 9:54 AM

## 2022-02-09 NOTE — Research (Signed)
[employer] 2021-05 - Specimen Collection Study to Evaluate Biomarkers in Subjects with Cancer   This Nurse has reviewed this patient's inclusion and exclusion criteria as a second review and confirms Diamond [name] is eligible for study participation.  Patient may continue with enrollment.  Foye Spurling, BSN, RN, [place] Nurse II 02/09/2022

## 2022-02-09 NOTE — Research (Signed)
Trial Name:  [employer] 2021-05 - Specimen Collection Study to Evaluate Biomarkers in Subjects with Cancer   Patient Diamond [name] was identified by Dr Lindi Adie as a potential candidate for the above listed study.  This Clinical Research Nurse met with Diamond [name], [id number] on 02/09/22 in a manner and location that ensures patient privacy to discuss participation in the above listed research study.  Patient is Unaccompanied.  Patient was previously provided with informed consent documents.  Patient confirmed they have read the informed consent documents.  As outlined in the informed consent form, this Nurse and Diamond [name] discussed the purpose of the research study, the investigational nature of the study, study procedures and requirements for study participation, potential risks and benefits of study participation, as well as alternatives to participation.  This study is not blinded or double-blinded. The patient understands participation is voluntary and they may withdraw from study participation at any time.  This study does not involve randomization.  This study does not involve an investigational drug or device. This study does not involve a placebo. Patient understands enrollment is pending full eligibility review.   Confidentiality and how the patient's information will be used as part of study participation were discussed.  Patient was informed there is reimbursement provided for their time and effort spent on trial participation.  The patient is encouraged to discuss research study participation with their insurance provider to determine what costs they may incur as part of study participation, including research related injury.    All questions were answered to patient's satisfaction.  The informed consent with embedded HIPAA language was reviewed page by page.  The patient's mental and emotional status is appropriate to provide informed consent, and the patient verbalizes  an understanding of study participation.  Patient has agreed to participate in the above listed research study and has voluntarily signed the informed consent version 28 Aug 2021 with embedded HIPAA language, version 28 Aug 2021  on 02/09/22 at 0900 AM.  The patient was provided with a copy of the signed informed consent form with embedded HIPAA language for their reference.  No study specific procedures were obtained prior to the signing of the informed consent document.  Approximately 20 minutes were spent with the patient reviewing the informed consent documents.  Patient was not requested to complete a Release of Information form.  Patient was given [employer] gift card; receipt signed. Accompanied patient to lab waiting.  Diamond Skiff Tomeshia Pizzi, RN, BSN, [facility]  [facility] Clinical Research Nurse [place] [phone]  Pager [phone] 02/09/2022 9:56 AM

## 2022-02-13 ENCOUNTER — Ambulatory Visit
Admission: RE | Admit: 2022-02-13 | Discharge: 2022-02-13 | Disposition: A | Discharge status: Home / Self Care | Payer: Medicare Other | Source: Ambulatory Visit | Attending: General Surgery | Admitting: General Surgery

## 2022-02-13 DIAGNOSIS — Z170 Estrogen receptor positive status [ER+]: Secondary | ICD-10-CM

## 2022-02-13 NOTE — Anesthesia Preprocedure Evaluation (Signed)
Anesthesia Evaluation  Patient identified by MRN, date of birth, ID band Patient awake    Reviewed: Allergy & Precautions, NPO status , Patient's Chart, lab work & pertinent test results  History of Anesthesia Complications Negative for: history of anesthetic complications  Airway Mallampati: II  TM Distance: >3 FB Neck ROM: Full    Dental no notable dental hx. (+) Dental Advisory Given   Pulmonary neg pulmonary ROS, former smoker,    Pulmonary exam normal        Cardiovascular hypertension, Pt. on medications Normal cardiovascular exam     Neuro/Psych negative neurological ROS     GI/Hepatic negative GI ROS, Neg liver ROS,   Endo/Other  negative endocrine ROS  Renal/GU negative Renal ROS     Musculoskeletal   Abdominal   Peds  Hematology negative hematology ROS (+)   Anesthesia Other Findings   Reproductive/Obstetrics                            Anesthesia Physical Anesthesia Plan  ASA: 2  Anesthesia Plan: General   Post-op Pain Management: Celebrex PO (pre-op)* and Tylenol PO (pre-op)*   Induction: Intravenous  PONV Risk Score and Plan: 4 or greater and Ondansetron, Dexamethasone, Diphenhydramine, Treatment may vary due to age or medical condition, TIVA and Propofol infusion  Airway Management Planned: LMA  Additional Equipment:   Intra-op Plan:   Post-operative Plan: Extubation in OR  Informed Consent: I have reviewed the patients History and Physical, chart, labs and discussed the procedure including the risks, benefits and alternatives for the proposed anesthesia with the patient or authorized representative who has indicated his/her understanding and acceptance.     Dental advisory given  Plan Discussed with: Anesthesiologist and CRNA  Anesthesia Plan Comments:       Anesthesia Quick Evaluation

## 2022-02-14 ENCOUNTER — Encounter (HOSPITAL_BASED_OUTPATIENT_CLINIC_OR_DEPARTMENT_OTHER): Payer: Self-pay | Admitting: General Surgery

## 2022-02-14 ENCOUNTER — Encounter (HOSPITAL_BASED_OUTPATIENT_CLINIC_OR_DEPARTMENT_OTHER)
Admission: RE | Disposition: A | Discharge status: Home / Self Care | Payer: Self-pay | Source: Ambulatory Visit | Attending: General Surgery

## 2022-02-14 ENCOUNTER — Ambulatory Visit
Admission: RE | Admit: 2022-02-14 | Discharge: 2022-02-14 | Disposition: A | Discharge status: Home / Self Care | Payer: Medicare Other | Source: Ambulatory Visit | Attending: General Surgery | Admitting: General Surgery

## 2022-02-14 ENCOUNTER — Ambulatory Visit (HOSPITAL_BASED_OUTPATIENT_CLINIC_OR_DEPARTMENT_OTHER)
Admission: RE | Admit: 2022-02-14 | Discharge: 2022-02-14 | Disposition: A | Discharge status: Home / Self Care | Payer: Medicare Other | Source: Ambulatory Visit | Attending: General Surgery | Admitting: General Surgery

## 2022-02-14 ENCOUNTER — Other Ambulatory Visit: Payer: Self-pay

## 2022-02-14 ENCOUNTER — Ambulatory Visit (HOSPITAL_BASED_OUTPATIENT_CLINIC_OR_DEPARTMENT_OTHER): Payer: Medicare Other | Admitting: Anesthesiology

## 2022-02-14 DIAGNOSIS — Z803 Family history of malignant neoplasm of breast: Secondary | ICD-10-CM | POA: Diagnosis not present

## 2022-02-14 DIAGNOSIS — I10 Essential (primary) hypertension: Secondary | ICD-10-CM | POA: Diagnosis not present

## 2022-02-14 DIAGNOSIS — C50312 Malignant neoplasm of lower-inner quadrant of left female breast: Secondary | ICD-10-CM | POA: Diagnosis present

## 2022-02-14 DIAGNOSIS — Z170 Estrogen receptor positive status [ER+]: Secondary | ICD-10-CM

## 2022-02-14 DIAGNOSIS — N6092 Unspecified benign mammary dysplasia of left breast: Secondary | ICD-10-CM | POA: Diagnosis not present

## 2022-02-14 DIAGNOSIS — Z87891 Personal history of nicotine dependence: Secondary | ICD-10-CM | POA: Diagnosis not present

## 2022-02-14 DIAGNOSIS — C50912 Malignant neoplasm of unspecified site of left female breast: Secondary | ICD-10-CM | POA: Diagnosis not present

## 2022-02-14 DIAGNOSIS — Z79899 Other long term (current) drug therapy: Secondary | ICD-10-CM | POA: Diagnosis not present

## 2022-02-14 HISTORY — DX: Unspecified osteoarthritis, unspecified site: M19.90

## 2022-02-14 HISTORY — DX: Essential (primary) hypertension: I10

## 2022-02-14 HISTORY — PX: BREAST LUMPECTOMY: SHX2

## 2022-02-14 HISTORY — PX: BREAST LUMPECTOMY WITH RADIOACTIVE SEED LOCALIZATION: SHX6424

## 2022-02-14 SURGERY — BREAST LUMPECTOMY WITH RADIOACTIVE SEED LOCALIZATION
Anesthesia: General | Site: Breast | Laterality: Left

## 2022-02-14 MED ORDER — BUPIVACAINE HCL (PF) 0.25 % IJ SOLN
INTRAMUSCULAR | Status: AC
  Filled 2022-02-14: qty 30

## 2022-02-14 MED ORDER — OXYCODONE HCL 5 MG PO TABS
5.0000 mg | ORAL_TABLET | ORAL | Status: DC | PRN
  Administered 2022-02-14: 5 mg via ORAL

## 2022-02-14 MED ORDER — ACETAMINOPHEN 500 MG PO TABS
1000.0000 mg | ORAL_TABLET | Freq: Once | ORAL | Status: AC
  Administered 2022-02-14: 1000 mg via ORAL

## 2022-02-14 MED ORDER — DIPHENHYDRAMINE HCL 50 MG/ML IJ SOLN
INTRAMUSCULAR | Status: AC
  Filled 2022-02-14: qty 1

## 2022-02-14 MED ORDER — DEXAMETHASONE SODIUM PHOSPHATE 10 MG/ML IJ SOLN
INTRAMUSCULAR | Status: DC | PRN
  Administered 2022-02-14: 5 mg via INTRAVENOUS

## 2022-02-14 MED ORDER — FENTANYL CITRATE (PF) 100 MCG/2ML IJ SOLN
25.0000 ug | INTRAMUSCULAR | Status: DC | PRN
  Administered 2022-02-14 (×2): 25 ug via INTRAVENOUS

## 2022-02-14 MED ORDER — SODIUM CHLORIDE (PF) 0.9 % IJ SOLN
INTRAMUSCULAR | Status: AC
  Filled 2022-02-14: qty 10

## 2022-02-14 MED ORDER — ACETAMINOPHEN 500 MG PO TABS: 1000.0000 mg | ORAL_TABLET | ORAL | Status: DC

## 2022-02-14 MED ORDER — BUPIVACAINE HCL (PF) 0.25 % IJ SOLN
INTRAMUSCULAR | Status: DC | PRN
  Administered 2022-02-14: 10 mL

## 2022-02-14 MED ORDER — AMISULPRIDE (ANTIEMETIC) 5 MG/2ML IV SOLN: 10.0000 mg | Freq: Once | INTRAVENOUS | Status: DC | PRN

## 2022-02-14 MED ORDER — FENTANYL CITRATE (PF) 100 MCG/2ML IJ SOLN
INTRAMUSCULAR | Status: AC
  Filled 2022-02-14: qty 2

## 2022-02-14 MED ORDER — ONDANSETRON HCL 4 MG/2ML IJ SOLN
INTRAMUSCULAR | Status: DC | PRN
  Administered 2022-02-14: 4 mg via INTRAVENOUS

## 2022-02-14 MED ORDER — CELECOXIB 200 MG PO CAPS
ORAL_CAPSULE | ORAL | Status: AC
  Filled 2022-02-14: qty 1

## 2022-02-14 MED ORDER — OXYCODONE HCL 5 MG PO TABS
ORAL_TABLET | ORAL | Status: AC
  Filled 2022-02-14: qty 1

## 2022-02-14 MED ORDER — LIDOCAINE HCL (CARDIAC) PF 100 MG/5ML IV SOSY
PREFILLED_SYRINGE | INTRAVENOUS | Status: DC | PRN
  Administered 2022-02-14: 100 mg via INTRATRACHEAL

## 2022-02-14 MED ORDER — CEFAZOLIN SODIUM-DEXTROSE 2-4 GM/100ML-% IV SOLN
INTRAVENOUS | Status: AC
  Filled 2022-02-14: qty 100

## 2022-02-14 MED ORDER — LIDOCAINE 2% (20 MG/ML) 5 ML SYRINGE
INTRAMUSCULAR | Status: AC
  Filled 2022-02-14: qty 5

## 2022-02-14 MED ORDER — DEXAMETHASONE SODIUM PHOSPHATE 10 MG/ML IJ SOLN
INTRAMUSCULAR | Status: AC
  Filled 2022-02-14: qty 1

## 2022-02-14 MED ORDER — PROPOFOL 10 MG/ML IV BOLUS
INTRAVENOUS | Status: AC
  Filled 2022-02-14: qty 20

## 2022-02-14 MED ORDER — DIPHENHYDRAMINE HCL 50 MG/ML IJ SOLN
INTRAMUSCULAR | Status: DC | PRN
  Administered 2022-02-14: 12.5 mg via INTRAVENOUS

## 2022-02-14 MED ORDER — PROPOFOL 10 MG/ML IV BOLUS
INTRAVENOUS | Status: DC | PRN
  Administered 2022-02-14: 50 mg via INTRAVENOUS
  Administered 2022-02-14: 150 mg via INTRAVENOUS

## 2022-02-14 MED ORDER — FENTANYL CITRATE (PF) 100 MCG/2ML IJ SOLN
INTRAMUSCULAR | Status: DC | PRN
  Administered 2022-02-14 (×2): 25 ug via INTRAVENOUS
  Administered 2022-02-14: 50 ug via INTRAVENOUS

## 2022-02-14 MED ORDER — PROMETHAZINE HCL 25 MG/ML IJ SOLN: 6.2500 mg | INTRAMUSCULAR | Status: DC | PRN

## 2022-02-14 MED ORDER — LACTATED RINGERS IV SOLN: INTRAVENOUS | Status: DC

## 2022-02-14 MED ORDER — CHLORHEXIDINE GLUCONATE CLOTH 2 % EX PADS
6.0000 | MEDICATED_PAD | Freq: Once | CUTANEOUS | Status: AC
  Administered 2022-02-14: 6 via TOPICAL

## 2022-02-14 MED ORDER — PROPOFOL 500 MG/50ML IV EMUL
INTRAVENOUS | Status: DC | PRN
  Administered 2022-02-14: 150 ug/kg/min via INTRAVENOUS

## 2022-02-14 MED ORDER — ACETAMINOPHEN 500 MG PO TABS
ORAL_TABLET | ORAL | Status: AC
  Filled 2022-02-14: qty 2

## 2022-02-14 MED ORDER — CEFAZOLIN SODIUM-DEXTROSE 2-4 GM/100ML-% IV SOLN
2.0000 g | INTRAVENOUS | Status: AC
  Administered 2022-02-14: 2 g via INTRAVENOUS

## 2022-02-14 MED ORDER — CELECOXIB 200 MG PO CAPS
200.0000 mg | ORAL_CAPSULE | Freq: Once | ORAL | Status: AC
  Administered 2022-02-14: 200 mg via ORAL

## 2022-02-14 MED ORDER — CHLORHEXIDINE GLUCONATE CLOTH 2 % EX PADS: 6.0000 | MEDICATED_PAD | Freq: Once | CUTANEOUS | Status: DC

## 2022-02-14 MED ORDER — ONDANSETRON HCL 4 MG/2ML IJ SOLN
INTRAMUSCULAR | Status: AC
  Filled 2022-02-14: qty 2

## 2022-02-14 SURGICAL SUPPLY — 49 items
ADH SKN CLS APL DERMABOND .7 (GAUZE/BANDAGES/DRESSINGS) ×1
APL PRP STRL LF DISP 70% ISPRP (MISCELLANEOUS) ×1
BINDER BREAST LRG (GAUZE/BANDAGES/DRESSINGS) ×1 IMPLANT
BINDER BREAST MEDIUM (GAUZE/BANDAGES/DRESSINGS) IMPLANT
BLADE SURG 15 STRL LF DISP TIS (BLADE) ×1 IMPLANT
BLADE SURG 15 STRL SS (BLADE) ×2
CANISTER SUCT 1200ML W/VALVE (MISCELLANEOUS) ×1 IMPLANT
CHLORAPREP W/TINT 26 (MISCELLANEOUS) ×2 IMPLANT
CLIP TI WIDE RED SMALL 6 (CLIP) IMPLANT
COVER BACK TABLE 60X90IN (DRAPES) ×2 IMPLANT
COVER MAYO STAND STRL (DRAPES) ×2 IMPLANT
COVER PROBE W GEL 5X96 (DRAPES) ×2 IMPLANT
DERMABOND ADVANCED (GAUZE/BANDAGES/DRESSINGS) ×1
DERMABOND ADVANCED .7 DNX12 (GAUZE/BANDAGES/DRESSINGS) ×1 IMPLANT
DRAPE LAPAROSCOPIC ABDOMINAL (DRAPES) ×2 IMPLANT
DRAPE UTILITY XL STRL (DRAPES) ×2 IMPLANT
ELECT COATED BLADE 2.86 ST (ELECTRODE) ×2 IMPLANT
ELECT REM PT RETURN 9FT ADLT (ELECTROSURGICAL) ×2
ELECTRODE REM PT RTRN 9FT ADLT (ELECTROSURGICAL) ×1 IMPLANT
GAUZE SPONGE 4X4 12PLY STRL LF (GAUZE/BANDAGES/DRESSINGS) IMPLANT
GLOVE BIO SURGEON STRL SZ7 (GLOVE) ×4 IMPLANT
GLOVE BIOGEL PI IND STRL 7.0 (GLOVE) IMPLANT
GLOVE BIOGEL PI IND STRL 7.5 (GLOVE) ×1 IMPLANT
GLOVE BIOGEL PI INDICATOR 7.0 (GLOVE) ×1
GLOVE BIOGEL PI INDICATOR 7.5 (GLOVE) ×1
GLOVE SURG SS PI 7.0 STRL IVOR (GLOVE) ×1 IMPLANT
GOWN STRL REUS W/ TWL LRG LVL3 (GOWN DISPOSABLE) ×2 IMPLANT
GOWN STRL REUS W/TWL LRG LVL3 (GOWN DISPOSABLE) ×4
HEMOSTAT ARISTA ABSORB 3G PWDR (HEMOSTASIS) ×1 IMPLANT
KIT MARKER MARGIN INK (KITS) ×2 IMPLANT
NDL HYPO 25X1 1.5 SAFETY (NEEDLE) ×1 IMPLANT
NEEDLE HYPO 25X1 1.5 SAFETY (NEEDLE) ×2 IMPLANT
NS IRRIG 1000ML POUR BTL (IV SOLUTION) ×1 IMPLANT
PACK BASIN DAY SURGERY FS (CUSTOM PROCEDURE TRAY) ×2 IMPLANT
PENCIL SMOKE EVACUATOR (MISCELLANEOUS) ×2 IMPLANT
SLEEVE SCD COMPRESS KNEE MED (STOCKING) ×2 IMPLANT
SPONGE T-LAP 4X18 ~~LOC~~+RFID (SPONGE) ×2 IMPLANT
STRIP CLOSURE SKIN 1/2X4 (GAUZE/BANDAGES/DRESSINGS) ×2 IMPLANT
SUT MON AB 5-0 PS2 18 (SUTURE) ×1 IMPLANT
SUT SILK 2 0 SH (SUTURE) ×1 IMPLANT
SUT VIC AB 2-0 SH 27 (SUTURE) ×4
SUT VIC AB 2-0 SH 27XBRD (SUTURE) ×1 IMPLANT
SUT VIC AB 3-0 SH 27 (SUTURE) ×2
SUT VIC AB 3-0 SH 27X BRD (SUTURE) ×1 IMPLANT
SYR CONTROL 10ML LL (SYRINGE) ×2 IMPLANT
TOWEL GREEN STERILE FF (TOWEL DISPOSABLE) ×2 IMPLANT
TRAY FAXITRON CT DISP (TRAY / TRAY PROCEDURE) ×2 IMPLANT
TUBE CONNECTING 20X1/4 (TUBING) ×1 IMPLANT
YANKAUER SUCT BULB TIP NO VENT (SUCTIONS) ×1 IMPLANT

## 2022-02-14 NOTE — Anesthesia Procedure Notes (Signed)
Procedure Name: LMA Insertion Date/Time: 02/14/2022 10:05 AM  Performed by: Glory Buff, CRNAPre-anesthesia Checklist: Patient identified, Emergency Drugs available, Suction available and Patient being monitored Patient Re-evaluated:Patient Re-evaluated prior to induction Oxygen Delivery Method: Circle system utilized Preoxygenation: Pre-oxygenation with 100% oxygen Induction Type: IV induction Ventilation: Mask ventilation without difficulty LMA: LMA inserted LMA Size: 3.0 Number of attempts: 1 Placement Confirmation: positive ETCO2 Tube secured with: Tape Dental Injury: Teeth and Oropharynx as per pre-operative assessment

## 2022-02-14 NOTE — H&P (Signed)
87 year old female who has a prior history of a benign left breast excisional biopsy. She does have a family history and her daughter. She has hypertension but is otherwise healthy. She had a screening mammogram that shows C density breast. She had a bilateral asymmetry. The right side ends up being nothing. There is an architectural distortion on the left. This underwent a biopsy that shows an invasive ductal carcinoma with DCIS that is grade 1. This is 100% ER positive, 10% PR positive, HER2 negative, and Ki-67 is less than 5%. She is here with her daughter Maudie Mercury to discuss options today.  Review of Systems: A complete review of systems was obtained from the patient. I have reviewed this information and discussed as appropriate with the patient. See HPI as well for other ROS.  Review of Systems  All other systems reviewed and are negative.   Medical History: Past Medical History:  Diagnosis Date  History of cancer   There is no problem list on file for this patient.  Past Surgical History:  Procedure Laterality Date  BREAST EXCISIONAL BIOPSY Left  JOINT REPLACEMENT   Allergies  Allergen Reactions  Penicillins Other (See Comments) and Swelling  Did it involve swelling of the face/tongue/throat, SOB, or low BP? No Did it involve sudden or severe rash/hives, skin peeling, or any reaction on the inside of your mouth or nose? No Did you need to seek medical attention at a hospital or doctor's office? No When did it last happen?10-15 YEARS If all above answers are "NO", may proceed with cephalosporin use.   Current Outpatient Medications on File Prior to Visit  Medication Sig Dispense Refill  olmesartan (BENICAR) 20 MG tablet    Family History  Problem Relation Age of Onset  Coronary Artery Disease (Blocked arteries around heart) Father  Breast cancer Daughter    Social History   Tobacco Use  Smoking Status Former  Types: Cigarettes  Smokeless Tobacco Never  Marital status:  Widowed  Tobacco Use  Smoking status: Former  Types: Cigarettes  Smokeless tobacco: Never  Substance and Sexual Activity  Alcohol use: Yes  Drug use: Never   Objective:   Vitals:  02/07/22 0820  BP: (!) 144/82  Pulse: (!) 117  Weight: 55.8 kg (123 lb)  Height: 160 cm (5' 3")   Body mass index is 21.79 kg/m.  Physical Exam Vitals reviewed.  Constitutional:  Appearance: Normal appearance.  Chest:  Breasts: Right: No inverted nipple, mass or nipple discharge.  Left: No inverted nipple, mass or nipple discharge.  Lymphadenopathy:  Upper Body:  Right upper body: No supraclavicular or axillary adenopathy.  Left upper body: No supraclavicular or axillary adenopathy.  Neurological:  Mental Status: She is alert.    Assessment and Plan:   Malignant neoplasm of lower-inner quadrant of left breast in female, estrogen receptor positive (CMS-HCC)   Left breast seed guided lumpectomy  We discussed the staging and pathophysiology of breast cancer. We discussed all of the different options for treatment for breast cancer including surgery, chemotherapy, radiation therapy, Herceptin, and antiestrogen therapy.  She has a negative clinical axilla. I do not think she needs any imaging done. From choosing wisely guidelines I do not think she needs sentinel node biopsy we discussed that today.  We discussed the options for treatment of the breast cancer which included lumpectomy versus a mastectomy. We discussed the performance of the lumpectomy with radioactive seed placement. We discussed a 5-10% chance of a positive margin requiring reexcision in the operating  room. We also discussed that she will likely not need radiation therapy if she undergoes lumpectomy but final decision would be made after surgery. We discussed mastectomy and the postoperative care for that as well. Mastectomy can be followed by reconstruction. The decision for lumpectomy vs mastectomy has no impact on decision for  chemotherapy. Most mastectomy patients will not need radiation therapy. We discussed that there is no difference in her survival whether she undergoes lumpectomy with radiation therapy or antiestrogen therapy versus a mastectomy. There is also no real difference between her recurrence in the breast.  We discussed the risks of operation including bleeding, infection, possible reoperation. She understands her further therapy will be based on what her stages at the time of her operation.

## 2022-02-14 NOTE — Op Note (Signed)
Preoperative diagnosis: Left breast cancer, clinical stage 1 Postoperative diagnosis: Same as above Procedure: Left breast radioactive seed guided lumpectomy Surgeon: Dr. Serita Grammes Estimated blood loss: Minimal Complications: None Drains: None Specimens: 1.  Left breast tissue containing seed and clip marked with paint 2.  Additional superior and posterior margins marked short stitch superior, long stitch lateral, double stitch deep Sponge and count was correct completion Disposition to recovery stable condition  Indications: 87 year old female who has a prior history of a benign left breast excisional biopsy. She had a screening mammogram that shows C density breast. She had a bilateral asymmetry. The right side ends up being nothing. There is an architectural distortion on the left. This underwent a biopsy that shows an invasive ductal carcinoma with DCIS that is grade 1. This is 100% ER positive, 10% PR positive, HER2 negative, and Ki-67 is less than 5%. We elected to proceed with lumpectomy alone  Procedure: After informed consent was obtained she first had a seed placed at the breast center.  I had these mammograms in the operating room.  She was given antibiotics.  SCDs were placed.  She was placed under general anesthesia without complication.  She was prepped and draped in the standard sterile surgical fashion.  I infiltrated Marcaine throughout the lower pole of the breast. I made an inframammary incision and dissected on the posterior plane to the seed I removed the seed and the surrounding tissue in that attempt to get a clear margin.  I then did a mammogram confirmed removal of the seed and the clip.  The 3D images showed it look like I was close to 2 margins.  I removed additional superior and posterior margins.   the posterior margin is now the muscle. These were marked as above.  I then obtained hemostasis.  I closed the cavity with 2-0 Vicryl.  The skin was closed with 3-0  Vicryl and 5-0 Monocryl.  Glue Steri-Strips were applied.  She tolerated this well was extubated and transferred recovery stable.

## 2022-02-14 NOTE — Interval H&P Note (Signed)
History and Physical Interval Note:  02/14/2022 9:57 AM  Diamond [name]  has presented today for surgery, with the diagnosis of LEFT BREAST CANCER.  The various methods of treatment have been discussed with the patient and family. After consideration of risks, benefits and other options for treatment, the patient has consented to  Procedure(s): LEFT BREAST LUMPECTOMY WITH RADIOACTIVE SEED LOCALIZATION (Left) as a surgical intervention.  The patient's history has been reviewed, patient examined, no change in status, stable for surgery.  I have reviewed the patient's chart and labs.  Questions were answered to the patient's satisfaction.     Rolm Bookbinder

## 2022-02-14 NOTE — Discharge Instructions (Addendum)
Diamond [name] Office Phone Number [phone]  POST OP INSTRUCTIONS Take 400 mg of ibuprofen every 8 hours or 650 mg tylenol every 6 hours for next 72 hours then as needed. Use ice several times daily also.  A prescription for pain medication may be given to you upon discharge.  Take your pain medication as prescribed, if needed.  If narcotic pain medicine is not needed, then you may take acetaminophen (Tylenol), naprosyn (Alleve) or ibuprofen (Advil) as needed. Take your usually prescribed medications unless otherwise directed If you need a refill on your pain medication, please contact your pharmacy.  They will contact our office to request authorization.  Prescriptions will not be filled after 5pm or on week-ends. You should eat very light the first 24 hours after surgery, such as soup, crackers, pudding, etc.  Resume your normal diet the day after surgery. Most patients will experience some swelling and bruising in the breast.  Ice packs and a good support bra will help.  Wear the breast binder provided or a sports bra for 72 hours day and night.  After that wear a sports bra during the day until you return to the office. Swelling and bruising can take several days to resolve.  It is common to experience some constipation if taking pain medication after surgery.  Increasing fluid intake and taking a stool softener will usually help or prevent this problem from occurring.  A mild laxative (Milk of Magnesia or Miralax) should be taken according to package directions if there are no bowel movements after 48 hours. I used skin glue on the incision, you may shower in 24 hours.  The glue will flake off over the next 2-3 weeks.  Any sutures or staples will be removed at the office during your follow-up visit. ACTIVITIES:  You may resume regular daily activities (gradually increasing) beginning the next day.  Wearing a good support bra or sports bra minimizes pain and swelling.  You may have  sexual intercourse when it is comfortable. You may drive when you no longer are taking prescription pain medication, you can comfortably wear a seatbelt, and you can safely maneuver your car and apply brakes. RETURN TO WORK:  ______________________________________________________________________________________ Diamond [name] should see your doctor in the office for a follow-up appointment approximately two weeks after your surgery.  Your doctor's nurse will typically make your follow-up appointment when she calls you with your pathology report.  Expect your pathology report 3-4 business days after your surgery.  You may call to check if you do not hear from [place] after three days. OTHER INSTRUCTIONS: _______________________________________________________________________________________________ _____________________________________________________________________________________________________________________________________ _____________________________________________________________________________________________________________________________________ _____________________________________________________________________________________________________________________________________  WHEN TO CALL [name]: Fever over 101.0 Nausea and/or vomiting. Extreme swelling or bruising. Continued bleeding from incision. Increased pain, redness, or drainage from the incision.  The clinic staff is available to answer your questions during regular business hours.  Please don't hesitate to call and ask to speak to one of the nurses for clinical concerns.  If you have a medical emergency, go to the nearest emergency room or call 911.  A surgeon from [facility] is always on call at the hospital.  For further questions, please visit [email or web address]   No Tylenol before 2:30pm No Ibuprofen/NSAIDS before 4:30pm   Post Anesthesia Home Care Instructions  Activity: Get plenty of rest for  the remainder of the day. A responsible individual must stay with you for 24 hours following the procedure.  For the next 24 hours, DO NOT: -Drive a car -Operate  machinery -Drink alcoholic beverages -Take any medication unless instructed by your physician -Make any legal decisions or sign important papers.  Meals: Start with liquid foods such as gelatin or soup. Progress to regular foods as tolerated. Avoid greasy, spicy, heavy foods. If nausea and/or vomiting occur, drink only clear liquids until the nausea and/or vomiting subsides. Call your physician if vomiting continues.  Special Instructions/Symptoms: Your throat may feel dry or sore from the anesthesia or the breathing tube placed in your throat during surgery. If this causes discomfort, gargle with warm salt water. The discomfort should disappear within 24 hours.  If you had a scopolamine patch placed behind your ear for the management of post- operative nausea and/or vomiting:  1. The medication in the patch is effective for 72 hours, after which it should be removed.  Wrap patch in a tissue and discard in the trash. Wash hands thoroughly with soap and water. 2. You may remove the patch earlier than 72 hours if you experience unpleasant side effects which may include dry mouth, dizziness or visual disturbances. 3. Avoid touching the patch. Wash your hands with soap and water after contact with the patch.

## 2022-02-14 NOTE — Anesthesia Postprocedure Evaluation (Signed)
Anesthesia Post Note  Patient: Diamond [name]  Procedure(s) Performed: LEFT BREAST LUMPECTOMY WITH RADIOACTIVE SEED LOCALIZATION (Left: Breast)     Patient location during evaluation: PACU Anesthesia Type: General Level of consciousness: sedated Pain management: pain level controlled Vital Signs Assessment: post-procedure vital signs reviewed and stable Respiratory status: spontaneous breathing and respiratory function stable Cardiovascular status: stable Postop Assessment: no apparent nausea or vomiting Anesthetic complications: no   No notable events documented.  Last Vitals:  Vitals:   02/14/22 1115 02/14/22 1142  BP: (!) 155/71 (!) 157/68  Pulse: 62 62  Resp: 17 14  Temp:  36.6 C  SpO2: 95% 95%    Last Pain:  Vitals:   02/14/22 1137  TempSrc:   PainSc: 4                  [name]

## 2022-02-14 NOTE — Transfer of Care (Signed)
Immediate Anesthesia Transfer of Care Note  Patient: Diamond [name]  Procedure(s) Performed: LEFT BREAST LUMPECTOMY WITH RADIOACTIVE SEED LOCALIZATION (Left: Breast)  Patient Location: PACU  Anesthesia Type:General  Level of Consciousness: drowsy, patient cooperative and responds to stimulation  Airway & Oxygen Therapy: Patient Spontanous Breathing and Patient connected to face mask oxygen  Post-op Assessment: Report given to RN and Post -op Vital signs reviewed and stable  Post vital signs: Reviewed and stable  Last Vitals:  Vitals Value Taken Time  BP    Temp    Pulse    Resp    SpO2      Last Pain:  Vitals:   02/14/22 0825  TempSrc: Oral  PainSc: 0-No pain      Patients Stated Pain Goal: 4 (18/84/16 6063)  Complications: No notable events documented.

## 2022-02-15 ENCOUNTER — Encounter (HOSPITAL_BASED_OUTPATIENT_CLINIC_OR_DEPARTMENT_OTHER): Payer: Self-pay | Admitting: General Surgery

## 2022-02-16 LAB — SURGICAL PATHOLOGY

## 2022-02-19 ENCOUNTER — Encounter: Payer: Self-pay | Admitting: *Deleted

## 2022-02-22 NOTE — Progress Notes (Signed)
Patient Care Team: [name], MD as PCP - General (Internal Medicine) Mauro Kaufmann, RN as Oncology Nurse Navigator Rockwell Germany, RN as Oncology Nurse Navigator Nicholas Lose, MD as Consulting Physician (Hematology and Oncology)  DIAGNOSIS:  Encounter Diagnosis  Name Primary?   Malignant neoplasm of lower-outer quadrant of left breast of female, estrogen receptor positive ([place])     SUMMARY OF ONCOLOGIC HISTORY: Oncology History  Malignant neoplasm of lower-outer quadrant of left breast of female, estrogen receptor positive ([place])  02/02/2022 Initial Diagnosis   Screening mammogram detected subtle architectural distortion lower left breast (posterior to the site of previous surgical excision): Grade 1 IDC with DCIS ER 100%, PR 10%, Ki-67 less than 5%, HER2 1+ negative   02/07/2022 Cancer Staging   Staging form: Breast, AJCC 8th Edition - Clinical: Stage IA (cT1, cN0, cM0, G1, ER+, PR+, HER2-) - Signed by Nicholas Lose, MD on 02/07/2022 Stage prefix: Initial diagnosis Histologic grading system: 3 grade system   02/14/2022 Surgery   Left lumpectomy: Grade 1 IDC 1.7 cm with DCIS intermediate grade, margins negative, ER 100%, PR 10%, HER2 negative with IHC 1+, Ki-67 less than 5%     CHIEF COMPLIANT: Follow-up after surgery lumpectomy discuss antiestrogen treatment plan    INTERVAL HISTORY: Diamond [name] is a 87 y.o. female is here because of recent diagnosis of left breast cancer. She presents to the clinic for a follow-up. She states surgery went well.  She denies any pain or discomfort.   ALLERGIES:  is allergic to penicillins.  MEDICATIONS:  Current Outpatient Medications  Medication Sig Dispense Refill   letrozole (FEMARA) 2.5 MG tablet Take 1 tablet (2.5 mg total) by mouth daily. 90 tablet 3   Cholecalciferol (VITAMIN D3 PO) Take 1 tablet by mouth at bedtime.     naproxen sodium (ALEVE) 220 MG tablet Take 220-440 mg by mouth daily as needed (PAIN.).      olmesartan (BENICAR) 20 MG tablet Take 20 mg by mouth daily.     Probiotic Product (ALIGN) 4 MG CAPS Take by mouth.     tretinoin (RETIN-A) 0.1 % cream Apply 1 application topically at bedtime.     No current facility-administered medications for this visit.    PHYSICAL EXAMINATION: ECOG PERFORMANCE STATUS: 0 - Asymptomatic  Vitals:   02/23/22 1145  BP: 139/88  Pulse: 93  Resp: 18  Temp: 97.8 F (36.6 C)  SpO2: 96%   Filed Weights   02/23/22 1145  Weight: 123 lb 12.8 oz (56.2 kg)        No results found for: "WBC", "HGB", "HCT", "MCV", "PLT", "NEUTROABS"  ASSESSMENT & PLAN:  Malignant neoplasm of lower-outer quadrant of left breast of female, estrogen receptor positive ([place]) 02/14/2022:Left lumpectomy: Grade 1 IDC 1.7 cm with DCIS intermediate grade, margins negative, ER 100%, PR 10%, HER2 negative with IHC 1+, Ki-67 less than 5%  Pathology counseling: I discussed the final pathology report of the patient provided  a copy of this report. I discussed the margins as well as lymph node surgeries. We also discussed the final staging along with previously performed ER/PR and HER-2/neu testing.  Treatment plan: Adjuvant antiestrogen therapy with letrozole 2.5 mg daily x5 years Radiation was felt to be optional  Letrozole counseling: We discussed the risks and benefits of anti-estrogen therapy with aromatase inhibitors. These include but not limited to insomnia, hot flashes, mood changes, vaginal dryness, bone density loss, and weight gain. We strongly believe that the benefits far outweigh the  risks. Patient understands these risks and consented to starting treatment. Planned treatment duration is 5 years.  Return to clinic in 3 months for survivorship care plan visit    No orders of the defined types were placed in this encounter.  The patient has a good understanding of the overall plan. she agrees with it. she will call with any problems that may develop before the next visit  here. Total time spent: 30 mins including face to face time and time spent for planning, charting and co-ordination of care   [name], MD 02/23/22    I Gardiner Coins am scribing for Dr. Lindi Adie  I have reviewed the above documentation for accuracy and completeness, and I agree with the above.

## 2022-02-23 ENCOUNTER — Encounter: Payer: Self-pay | Admitting: *Deleted

## 2022-02-23 ENCOUNTER — Other Ambulatory Visit: Payer: Self-pay

## 2022-02-23 ENCOUNTER — Inpatient Hospital Stay: Payer: Medicare Other | Admitting: Hematology and Oncology

## 2022-02-23 DIAGNOSIS — C50512 Malignant neoplasm of lower-outer quadrant of left female breast: Secondary | ICD-10-CM | POA: Diagnosis not present

## 2022-02-23 DIAGNOSIS — Z170 Estrogen receptor positive status [ER+]: Secondary | ICD-10-CM | POA: Diagnosis not present

## 2022-02-23 MED ORDER — LETROZOLE 2.5 MG PO TABS: 2.5000 mg | ORAL_TABLET | Freq: Every day | ORAL | 3 refills | Status: DC

## 2022-02-23 NOTE — Assessment & Plan Note (Signed)
02/14/2022:Left lumpectomy: Grade 1 IDC 1.7 cm with DCIS intermediate grade, margins negative, ER 100%, PR 10%, HER2 negative with IHC 1+, Ki-67 less than 5%  Pathology counseling: I discussed the final pathology report of the patient provided  a copy of this report. I discussed the margins as well as lymph node surgeries. We also discussed the final staging along with previously performed ER/PR and HER-2/neu testing.  Treatment plan: Adjuvant antiestrogen therapy with letrozole 2.5 mg daily x5 years Radiation was felt to be optional  Letrozole counseling: We discussed the risks and benefits of anti-estrogen therapy with aromatase inhibitors. These include but not limited to insomnia, hot flashes, mood changes, vaginal dryness, bone density loss, and weight gain. We strongly believe that the benefits far outweigh the risks. Patient understands these risks and consented to starting treatment. Planned treatment duration is 5 years.  Return to clinic in 3 months for survivorship care plan visit

## 2022-02-28 ENCOUNTER — Encounter (HOSPITAL_COMMUNITY): Payer: Self-pay

## 2022-05-28 ENCOUNTER — Encounter: Payer: Self-pay | Admitting: Adult Health

## 2022-05-28 ENCOUNTER — Other Ambulatory Visit: Payer: Self-pay

## 2022-05-28 ENCOUNTER — Inpatient Hospital Stay: Payer: Medicare Other | Attending: Adult Health | Admitting: Adult Health

## 2022-05-28 VITALS — BP 137/67 | HR 95 | Temp 97.7°F | Resp 18 | Ht 63.0 in | Wt 126.2 lb

## 2022-05-28 DIAGNOSIS — Z79811 Long term (current) use of aromatase inhibitors: Secondary | ICD-10-CM | POA: Insufficient documentation

## 2022-05-28 DIAGNOSIS — C50512 Malignant neoplasm of lower-outer quadrant of left female breast: Secondary | ICD-10-CM | POA: Insufficient documentation

## 2022-05-28 DIAGNOSIS — Z170 Estrogen receptor positive status [ER+]: Secondary | ICD-10-CM | POA: Diagnosis not present

## 2022-05-28 NOTE — Progress Notes (Signed)
SURVIVORSHIP VISIT:   BRIEF ONCOLOGIC HISTORY:  Oncology History  Malignant neoplasm of lower-outer quadrant of left breast of female, estrogen receptor positive ([place])  02/02/2022 Initial Diagnosis   Screening mammogram detected subtle architectural distortion lower left breast (posterior to the site of previous surgical excision): Grade 1 IDC with DCIS ER 100%, PR 10%, Ki-67 less than 5%, HER2 1+ negative   02/07/2022 Cancer Staging   Staging form: Breast, AJCC 8th Edition - Clinical: Stage IA (cT1, cN0, cM0, G1, ER+, PR+, HER2-) - Signed by Nicholas Lose, MD on 02/07/2022 Stage prefix: Initial diagnosis Histologic grading system: 3 grade system   02/14/2022 Surgery   Left lumpectomy: Grade 1 IDC 1.7 cm with DCIS intermediate grade, margins negative, ER 100%, PR 10%, HER2 negative with IHC 1+, Ki-67 less than 5%   02/23/2022 -  Anti-estrogen oral therapy   Letrozole x 5 years     INTERVAL HISTORY:  Diamond [name] to review her survivorship care plan detailing her treatment course for breast cancer, as well as monitoring long-term side effects of that treatment, education regarding health maintenance, screening, and overall wellness and health promotion.     Overall, Diamond [name] reports feeling quite well.  She is taking Letrozole daily and tolerates it well.  She denies any arthralgias, hot flashes, or vaginal dryness.  She exercises regularly.    REVIEW OF SYSTEMS:  Review of Systems  Constitutional:  Negative for appetite change, chills, fatigue, fever and unexpected weight change.  HENT:   Negative for hearing loss, lump/mass and trouble swallowing.   Eyes:  Negative for eye problems and icterus.  Respiratory:  Negative for chest tightness, cough and shortness of breath.   Cardiovascular:  Negative for chest pain, leg swelling and palpitations.  Gastrointestinal:  Negative for abdominal distention, abdominal pain, constipation, diarrhea, nausea and vomiting.  Endocrine: Negative for hot  flashes.  Genitourinary:  Negative for difficulty urinating.   Musculoskeletal:  Negative for arthralgias.  Skin:  Negative for itching and rash.  Neurological:  Negative for dizziness, extremity weakness, headaches and numbness.  Hematological:  Negative for adenopathy. Does not bruise/bleed easily.  Psychiatric/Behavioral:  Negative for depression. The patient is not nervous/anxious.   Breast: Denies any new nodularity, masses, tenderness, nipple changes, or nipple discharge.      ONCOLOGY TREATMENT TEAM:  1. Surgeon:  Dr. Donne Hazel at [facility] 2. Medical Oncologist: Dr. Lindi Adie   PAST MEDICAL/SURGICAL HISTORY:  Past Medical History:  Diagnosis Date   Arthritis    knees   Cancer ([place]) 01/2022   left breast IDC w/ DCIS   Fracture    right elbow   Hypertension    Wears glasses    Past Surgical History:  Procedure Laterality Date   BREAST BIOPSY Left 02/02/2022   BREAST LUMPECTOMY WITH RADIOACTIVE SEED LOCALIZATION Left 02/14/2022   Procedure: LEFT BREAST LUMPECTOMY WITH RADIOACTIVE SEED LOCALIZATION;  Surgeon: Rolm Bookbinder, MD;  Location: [place];  Service: General;  Laterality: Left;   BREAST SURGERY     benign fibroid    CATARACT EXTRACTION, BILATERAL     COLONOSCOPY W/ BIOPSIES AND POLYPECTOMY     FRACTURE SURGERY     left   ORIF ELBOW FRACTURE Right 08/08/2019   Procedure: OPEN REDUCTION INTERNAL FIXATION (ORIF) DISPLACED RIGHT ELBOW/OLECRANON FRACTURE WITH ULNAR NERVE RELEASE;  Surgeon: Roseanne Kaufman, MD;  Location: [place];  Service: Orthopedics;  Laterality: Right;  90 MINS BLOCK WITH IV SED   PARTIAL HYSTERECTOMY  ALLERGIES:  Allergies  Allergen Reactions   Penicillins Other (See Comments)    Did it involve swelling of the face/tongue/throat, SOB, or low BP? No Did it involve sudden or severe rash/hives, skin peeling, or any reaction on the inside of your mouth or nose? No Did you need to seek medical attention at  a hospital or doctor's office? No When did it last happen?10-15 YEARS If all above answers are "NO", may proceed with cephalosporin use.  JOINT SWELLING      CURRENT MEDICATIONS:  Outpatient Encounter Medications as of 05/28/2022  Medication Sig   Cholecalciferol (VITAMIN D3 PO) Take 1 tablet by mouth at bedtime.   letrozole (FEMARA) 2.5 MG tablet Take 1 tablet (2.5 mg total) by mouth daily.   olmesartan (BENICAR) 20 MG tablet Take 20 mg by mouth daily.   olmesartan (BENICAR) 20 MG tablet Take 20 mg by mouth daily.   Probiotic Product (ALIGN) 4 MG CAPS Take by mouth.   tretinoin (RETIN-A) 0.1 % cream Apply 1 application topically at bedtime.   [DISCONTINUED] naproxen sodium (ALEVE) 220 MG tablet Take 220-440 mg by mouth daily as needed (PAIN.). (Patient not taking: Reported on 05/28/2022)   No facility-administered encounter medications on file as of 05/28/2022.     ONCOLOGIC FAMILY HISTORY:  Family History  Problem Relation Age of Onset   Breast cancer Daughter       SOCIAL HISTORY:  Social History   Socioeconomic History   Marital status: Widowed    Spouse name: Not on file   Number of children: Not on file   Years of education: Not on file   Highest education level: Not on file  Occupational History   Not on file  Tobacco Use   Smoking status: Former   Smokeless tobacco: Never   Tobacco comments:    " quit smoking cigarettes in 1987 "  Vaping Use   Vaping Use: Never used  Substance and Sexual Activity   Alcohol use: Yes    Comment: 3-4 times a week wine   Drug use: Never   Sexual activity: Not Currently    Birth control/protection: Surgical    Comment: hyst  Other Topics Concern   Not on file  Social History Narrative   Not on file   Social Determinants of Health   Financial Resource Strain: Not on file  Food Insecurity: Not on file  Transportation Needs: Not on file  Physical Activity: Not on file  Stress: Not on file  Social Connections: Not on  file  Intimate Partner Violence: Not on file     OBSERVATIONS/OBJECTIVE:  BP 137/67 (BP Location: Left Arm, Patient Position: Sitting)   Pulse 95   Temp 97.7 F (36.5 C) (Tympanic)   Resp 18   Ht _0  (1.6 m)   Wt 126 lb 3.2 oz (57.2 kg)   SpO2 100%   BMI 22.36 kg/m  GENERAL: Patient is a well appearing female in no acute distress HEENT:  Sclerae anicteric.  Oropharynx clear and moist. No ulcerations or evidence of oropharyngeal candidiasis. Neck is supple.  NODES:  No cervical, supraclavicular, or axillary lymphadenopathy palpated.  BREAST EXAM: Left breast status postlumpectomy no sign of local recurrence right breast is benign. LUNGS:  Clear to auscultation bilaterally.  No wheezes or rhonchi. HEART:  Regular rate and rhythm. No murmur appreciated. ABDOMEN:  Soft, nontender.  Positive, normoactive bowel sounds. No organomegaly palpated. MSK:  No focal spinal tenderness to palpation. Full range of motion bilaterally in  the upper extremities. EXTREMITIES:  No peripheral edema.   SKIN:  Clear with no obvious rashes or skin changes. No nail dyscrasia. NEURO:  Nonfocal. Well oriented.  Appropriate affect.   LABORATORY DATA:  None for this visit.  DIAGNOSTIC IMAGING:  None for this visit.      ASSESSMENT AND PLAN:  Diamond [name] is a pleasant 87 y.o. female with Stage IA left breast invasive ductal carcinoma, ER+/PR+/HER2-, diagnosed in July 2023, treated with lumpectomy and anti-estrogen therapy with letrozole beginning in August 2023.  She presents to the Survivorship Clinic for our initial meeting and routine follow-up post-completion of treatment for breast cancer.    1. Stage 1A left breast cancer:  Diamond [name] is continuing to recover from definitive treatment for breast cancer. She will follow-up with her medical oncologist, Dr. Lindi Adie in 6 months with history and physical exam per surveillance protocol.  She will continue her anti-estrogen therapy with letrozole. Thus far,  she is tolerating the letrozole well, with minimal side effects. She was instructed to make Dr. Lindi Adie or myself aware if she begins to experience any worsening side effects of the medication and I could see her back in clinic to help manage those side effects, as needed. Her mammogram is due June 2024; orders placed today. Today, a comprehensive survivorship care plan and treatment summary was reviewed with the patient today detailing her breast cancer diagnosis, treatment course, potential late/long-term effects of treatment, appropriate follow-up care with recommendations for the future, and patient education resources.  A copy of this summary, along with a letter will be sent to the patient's primary care provider via mail/fax/In Basket message after today's visit.    2. Bone health:  Given Diamond [name] age/history of breast cancer and her current treatment regimen including anti-estrogen therapy with Letrozole, she is at risk for bone demineralization.  Her last DEXA scan was several years ago, therefore I ordered repeat testing today.  In the meantime, she was encouraged to increase her consumption of foods rich in calcium, as well as increase her weight-bearing activities.  She was given education on specific activities to promote bone health.  3. Cancer screening:  Due to Diamond [name] history and her age, she should receive screening for skin cancers.  The information and recommendations are listed on the patient's comprehensive care plan/treatment summary and were reviewed in detail with the patient.    4. Health maintenance and wellness promotion: Diamond [name] was encouraged to consume 5-7 servings of fruits and vegetables per day. We reviewed the "Nutrition Rainbow" handout.  She was also encouraged to engage in moderate to vigorous exercise for 30 minutes per day most days of the week. We discussed the [employer] fitness program, which is designed for cancer survivors to help them become more  physically fit after cancer treatments.  She was instructed to limit her alcohol consumption and continue to abstain from tobacco use.  I recommended that she stay up-to-date with her annual wellness visits and see her primary care regularly to stay on top of her vaccinations.  We are requesting a copy of her vaccination records so we can update her care gaps in epic.   5. Support services/counseling: It is not uncommon for this period of the patient's cancer care trajectory to be one of many emotions and stressors.  She was given information regarding our available services and encouraged to contact me with any questions or for help enrolling in any of our support group/programs.  Follow up instructions:    -Return to cancer center in 6 months for follow-up with Dr. Lindi Adie -Mammogram due in June 2024 -Bone density testing ordered and pending -Follow up with surgery in 1 year -She is welcome to return back to the Survivorship Clinic at any time; no additional follow-up needed at this time.  -Consider referral back to survivorship as a long-term survivor for continued surveillance  The patient was provided an opportunity to ask questions and all were answered. The patient agreed with the plan and demonstrated an understanding of the instructions.   Total encounter time:35 minutes*in face-to-face visit time, chart review, lab review, care coordination, order entry, and documentation of the encounter time.    Wilber Bihari, NP 05/28/22 9:44 AM Medical Oncology and Hematology [facility] [place], [place] [address] Tel. [phone]    Fax. [phone]  *Total Encounter Time as defined by the Centers for Medicare and Medicaid Services includes, in addition to the face-to-face time of a patient visit (documented in the note above) non-face-to-face time: obtaining and reviewing outside history, ordering and reviewing medications, tests or procedures, care  coordination (communications with other health care professionals or caregivers) and documentation in the medical record.

## 2022-05-30 ENCOUNTER — Telehealth: Payer: Self-pay | Admitting: Adult Health

## 2022-05-30 NOTE — Telephone Encounter (Signed)
Scheduled appointment per 10/30 los. Patient is aware.

## 2022-06-13 ENCOUNTER — Ambulatory Visit (HOSPITAL_BASED_OUTPATIENT_CLINIC_OR_DEPARTMENT_OTHER)
Admission: RE | Admit: 2022-06-13 | Discharge: 2022-06-13 | Disposition: A | Discharge status: Home / Self Care | Payer: Medicare Other | Source: Ambulatory Visit | Attending: Adult Health | Admitting: Adult Health

## 2022-06-13 DIAGNOSIS — C50512 Malignant neoplasm of lower-outer quadrant of left female breast: Secondary | ICD-10-CM | POA: Insufficient documentation

## 2022-06-13 DIAGNOSIS — Z170 Estrogen receptor positive status [ER+]: Secondary | ICD-10-CM | POA: Diagnosis present

## 2022-06-13 DIAGNOSIS — Z780 Asymptomatic menopausal state: Secondary | ICD-10-CM | POA: Insufficient documentation

## 2022-06-13 DIAGNOSIS — M810 Age-related osteoporosis without current pathological fracture: Secondary | ICD-10-CM | POA: Diagnosis not present

## 2022-06-13 DIAGNOSIS — Z13820 Encounter for screening for osteoporosis: Secondary | ICD-10-CM | POA: Insufficient documentation

## 2022-06-15 ENCOUNTER — Telehealth: Payer: Self-pay | Admitting: *Deleted

## 2022-06-15 NOTE — Telephone Encounter (Signed)
Per Wilber Bihari, DNP, called pt with message below. Pt was scheduled and called with appt. Pt verbalized understanding

## 2022-06-15 NOTE — Telephone Encounter (Signed)
-----   Message from Gardenia Phlegm, NP sent at 06/14/2022  4:03 PM EST ----- Please schedule phone appt to review results ----- Message ----- From: Interface, Rad Results In Sent: 06/13/2022  11:33 AM EST To: Gardenia Phlegm, NP

## 2022-06-19 ENCOUNTER — Inpatient Hospital Stay: Payer: Medicare Other | Attending: Adult Health | Admitting: Adult Health

## 2022-06-19 ENCOUNTER — Telehealth: Payer: Self-pay | Admitting: Hematology and Oncology

## 2022-06-19 DIAGNOSIS — Z170 Estrogen receptor positive status [ER+]: Secondary | ICD-10-CM | POA: Diagnosis not present

## 2022-06-19 DIAGNOSIS — C50512 Malignant neoplasm of lower-outer quadrant of left female breast: Secondary | ICD-10-CM | POA: Diagnosis not present

## 2022-06-19 DIAGNOSIS — M816 Localized osteoporosis [Lequesne]: Secondary | ICD-10-CM

## 2022-06-19 MED ORDER — ALENDRONATE SODIUM 70 MG PO TABS: 70.0000 mg | ORAL_TABLET | ORAL | 3 refills | Status: DC

## 2022-06-19 NOTE — Progress Notes (Signed)
[facility] Follow up:    Diamond [name], [place] [address]   DIAGNOSIS:  Cancer Staging  Malignant neoplasm of lower-outer quadrant of left breast of female, estrogen receptor positive ([place]) Staging form: Breast, AJCC 8th Edition - Clinical: Stage IA (cT1, cN0, cM0, G1, ER+, PR+, HER2-) - Signed by Nicholas Lose, MD on 02/07/2022 Stage prefix: Initial diagnosis Histologic grading system: 3 grade system  I connected with Darbie [name] on 06/22/22 at  2:45 PM EST by telephone and verified that I am speaking with the correct person using two identifiers.  I discussed the limitations, risks, security and privacy concerns of performing an evaluation and management service by telephone and the availability of in person appointments.  I also discussed with the patient that there may be a patient responsible charge related to this service. The patient expressed understanding and agreed to proceed.  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, estrogen receptor positive ([place])  02/02/2022 Initial Diagnosis   Screening mammogram detected subtle architectural distortion lower left breast (posterior to the site of previous surgical excision): Grade 1 IDC with DCIS ER 100%, PR 10%, Ki-67 less than 5%, HER2 1+ negative   02/07/2022 Cancer Staging   Staging form: Breast, AJCC 8th Edition - Clinical: Stage IA (cT1, cN0, cM0, G1, ER+, PR+, HER2-) - Signed by Nicholas Lose, MD on 02/07/2022 Stage prefix: Initial diagnosis Histologic grading system: 3 grade system   02/14/2022 Surgery   Left lumpectomy: Grade 1 IDC 1.7 cm with DCIS intermediate grade, margins negative, ER 100%, PR 10%, HER2 negative with IHC 1+, Ki-67 less than 5%   02/23/2022 -  Anti-estrogen oral therapy   Letrozole x 5 years     CURRENT THERAPY: Letrozole    INTERVAL HISTORY: Diamond [name] 87 y.o. female returns for discussion of her bone density results.  She underwent bone density testing on June 13, 2022 and it demonstrated osteoporosis in her spine at -3.9.  Considering the fact that she is taking letrozole daily this can make it worse so we are talking to discuss possibly starting bisphosphonate therapy.  She is tolerating the letrozole daily without any difficulty.   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  MEDICAL HISTORY: Past Medical History:  Diagnosis Date   Arthritis    knees   Cancer ([place]) 01/2022   left breast IDC w/ DCIS   Fracture    right elbow   Hypertension    Wears glasses     SURGICAL HISTORY: Past Surgical History:  Procedure Laterality Date   BREAST BIOPSY Left 02/02/2022   BREAST LUMPECTOMY WITH RADIOACTIVE SEED LOCALIZATION Left 02/14/2022   Procedure: LEFT BREAST LUMPECTOMY WITH RADIOACTIVE SEED LOCALIZATION;  Surgeon: Rolm Bookbinder, MD;  Location: [facility];  Service: General;  Laterality: Left;   BREAST SURGERY     benign fibroid    CATARACT EXTRACTION, BILATERAL     COLONOSCOPY W/ BIOPSIES AND POLYPECTOMY     FRACTURE SURGERY     left   ORIF ELBOW FRACTURE Right 08/08/2019   Procedure: OPEN REDUCTION INTERNAL FIXATION (ORIF) DISPLACED RIGHT ELBOW/OLECRANON FRACTURE WITH ULNAR NERVE RELEASE;  Surgeon: Roseanne Kaufman, MD;  Location: [facility];  Service: Orthopedics;  Laterality: Right;  90 MINS BLOCK WITH IV SED   PARTIAL HYSTERECTOMY      SOCIAL HISTORY: Social History   Socioeconomic History   Marital status: Widowed    Spouse name: Not on file   Number of children: Not on file   Years of education: Not on file   Highest  education level: Not on file  Occupational History   Not on file  Tobacco Use   Smoking status: Former   Smokeless tobacco: Never   Tobacco comments:    " quit smoking cigarettes in 1987 "  Vaping Use   Vaping Use: Never used  Substance and Sexual Activity   Alcohol use: Yes    Comment: 3-4 times a week wine   Drug use: Never   Sexual activity: Not Currently    Birth control/protection: Surgical    Comment: hyst  Other Topics Concern   Not on file  Social History Narrative   Not on file   Social Determinants of Health   Financial Resource Strain: Not on file  Food Insecurity: Not on file  Transportation Needs: Not on file  Physical Activity: Not on file  Stress: Not on file  Social Connections: Not on file  Intimate Partner Violence: Not on file    FAMILY HISTORY: Family History  Problem Relation Age of Onset   Breast cancer Daughter     Review of Systems  Constitutional:  Negative for appetite change, chills, fatigue, fever and unexpected weight change.  HENT:   Negative for hearing loss, lump/mass and trouble swallowing.   Eyes:  Negative for eye problems and icterus.  Respiratory:  Negative for chest tightness, cough and shortness of breath.   Cardiovascular:  Negative for chest pain, leg swelling and palpitations.  Gastrointestinal:  Negative for abdominal distention, abdominal pain, constipation, diarrhea, nausea and vomiting.  Endocrine: Negative for hot flashes.  Genitourinary:  Negative for difficulty urinating.   Musculoskeletal:  Negative for arthralgias.  Skin:  Negative for itching and rash.  Neurological:  Negative for dizziness, extremity weakness, headaches and numbness.  Hematological:  Negative for adenopathy. Does not bruise/bleed easily.  Psychiatric/Behavioral:  Negative for depression. The patient is not nervous/anxious.       PHYSICAL EXAMINATION Patient sounds well she is in no apparent distress mood and behavior are normal breathing is  nonlabored.  LABORATORY DATA: None for this visit    ASSESSMENT and THERAPY PLAN:   Malignant neoplasm of lower-outer quadrant of left breast of female, estrogen receptor positive ([place]) Diamond [name] is an 87 year old woman with history of stage Ia ER/PR positive breast cancer diagnosed in July 2023 status postlumpectomy and antiestrogen therapy with letrozole since July 2023.  She continues on letrozole daily with good tolerance.  Her most recent bone density was consistent with osteoporosis therefore I recommended that we start Fosamax weekly.  We discussed risks benefits of the therapy including the possibility of osteonecrosis of the jaw which she verbalized understanding of.  I reviewed the importance of her taking calcium either through her diet or supplementation.  We discussed weightbearing exercises as well.  With the Fosamax I also recommended that she drink a full glass of water with this and sit upright for 30 minutes to an hour after taking.  I added a lab appointment onto her visit with Dr. Lindi Adie so we can check her calcium.  She will return in April 2023 for her labs and follow-up  with Dr. Lindi Adie.    All questions were answered. The patient knows to call the clinic with any problems, questions or concerns. We can certainly see the patient much sooner if necessary.  Follow up instructions:    -Return to cancer center 10/2022  -Take Fosamax weekly and let us know if you have any difficulties  The patient was provided an opportunity to ask questions and all were answered. The patient agreed with the plan and demonstrated an understanding of the instructions.   The patient was advised to call back or seek an in-person evaluation if the symptoms worsen or if the condition fails to improve as anticipated.   I provided 15 minutes of non face-to-face telephone visit time during this encounter, and > 50% was spent counseling as documented under my assessment & plan.    Wilber Bihari, NP 06/22/22 2:18 PM Medical Oncology and Hematology [facility] [place], [place] [address] Tel. [phone]    Fax. [phone]

## 2022-06-19 NOTE — Telephone Encounter (Signed)
Scheduled appointment per 11/20 los. Patient is aware. 

## 2022-06-22 NOTE — Assessment & Plan Note (Signed)
Diamond [name] is an 87 year old woman with history of stage Ia ER/PR positive breast cancer diagnosed in July 2023 status postlumpectomy and antiestrogen therapy with letrozole since July 2023.  She continues on letrozole daily with good tolerance.  Her most recent bone density was consistent with osteoporosis therefore I recommended that we start Fosamax weekly.  We discussed risks benefits of the therapy including the possibility of osteonecrosis of the jaw which she verbalized understanding of.  I reviewed the importance of her taking calcium either through her diet or supplementation.  We discussed weightbearing exercises as well.  With the Fosamax I also recommended that she drink a full glass of water with this and sit upright for 30 minutes to an hour after taking.  I added a lab appointment onto her visit with Dr. Lindi Adie so we can check her calcium.  She will return in April 2023 for her labs and follow-up with Dr. Lindi Adie.

## 2022-11-27 ENCOUNTER — Other Ambulatory Visit: Payer: Self-pay

## 2022-11-27 ENCOUNTER — Inpatient Hospital Stay: Payer: Medicare Other | Attending: Hematology and Oncology | Admitting: Hematology and Oncology

## 2022-11-27 ENCOUNTER — Inpatient Hospital Stay: Payer: Medicare Other

## 2022-11-27 ENCOUNTER — Encounter: Payer: Self-pay | Admitting: *Deleted

## 2022-11-27 ENCOUNTER — Other Ambulatory Visit: Payer: Self-pay | Admitting: *Deleted

## 2022-11-27 VITALS — BP 147/74 | HR 85 | Temp 97.6°F | Resp 18 | Ht 63.0 in | Wt 125.0 lb

## 2022-11-27 DIAGNOSIS — C50512 Malignant neoplasm of lower-outer quadrant of left female breast: Secondary | ICD-10-CM

## 2022-11-27 DIAGNOSIS — Z79811 Long term (current) use of aromatase inhibitors: Secondary | ICD-10-CM | POA: Insufficient documentation

## 2022-11-27 DIAGNOSIS — Z170 Estrogen receptor positive status [ER+]: Secondary | ICD-10-CM | POA: Insufficient documentation

## 2022-11-27 DIAGNOSIS — M810 Age-related osteoporosis without current pathological fracture: Secondary | ICD-10-CM | POA: Diagnosis not present

## 2022-11-27 DIAGNOSIS — M816 Localized osteoporosis [Lequesne]: Secondary | ICD-10-CM

## 2022-11-27 LAB — CMP (CANCER CENTER ONLY)
ALT: 13 U/L (ref 0–44)
AST: 17 U/L (ref 15–41)
Albumin: 4.2 g/dL (ref 3.5–5.0)
Alkaline Phosphatase: 68 U/L (ref 38–126)
Anion gap: 4 — ABNORMAL LOW (ref 5–15)
BUN: 15 mg/dL (ref 8–23)
CO2: 30 mmol/L (ref 22–32)
Calcium: 9.8 mg/dL (ref 8.9–10.3)
Chloride: 107 mmol/L (ref 98–111)
Creatinine: 0.84 mg/dL (ref 0.44–1.00)
GFR, Estimated: >60 mL/min (ref >60)
Glucose, Bld: 108 mg/dL — ABNORMAL HIGH (ref 70–99)
Potassium: 4.3 mmol/L (ref 3.5–5.1)
Sodium: 141 mmol/L (ref 135–145)
Total Bilirubin: 0.7 mg/dL (ref 0.3–1.2)
Total Protein: 6.7 g/dL (ref 6.5–8.1)

## 2022-11-27 LAB — CBC WITH DIFFERENTIAL (CANCER CENTER ONLY)
Abs Immature Granulocytes: 0.02 10*3/uL (ref 0.00–0.07)
Basophils Absolute: 0 10*3/uL (ref 0.0–0.1)
Basophils Relative: 1 %
Eosinophils Absolute: 0.2 10*3/uL (ref 0.0–0.5)
Eosinophils Relative: 2 %
HCT: 47.6 % — ABNORMAL HIGH (ref 36.0–46.0)
Hemoglobin: 15.6 g/dL — ABNORMAL HIGH (ref 12.0–15.0)
Immature Granulocytes: 0 %
Lymphocytes Relative: 14 %
Lymphs Abs: 0.9 10*3/uL (ref 0.7–4.0)
MCH: 30.2 pg (ref 26.0–34.0)
MCHC: 32.8 g/dL (ref 30.0–36.0)
MCV: 92.2 fL (ref 80.0–100.0)
Monocytes Absolute: 0.6 10*3/uL (ref 0.1–1.0)
Monocytes Relative: 10 %
Neutro Abs: 4.7 10*3/uL (ref 1.7–7.7)
Neutrophils Relative %: 73 %
Platelet Count: 267 10*3/uL (ref 150–400)
RBC: 5.16 MIL/uL — ABNORMAL HIGH (ref 3.87–5.11)
RDW: 13.1 % (ref 11.5–15.5)
WBC Count: 6.4 10*3/uL (ref 4.0–10.5)
nRBC: 0 % (ref 0.0–0.2)

## 2022-11-27 NOTE — Progress Notes (Addendum)
[facility]  Patient Care Team: Geoffry Paradise, MD as PCP - General (Internal Medicine) Serena Croissant, MD as Consulting Physician (Hematology and Oncology) Emelia Loron, MD as Consulting Physician (General Surgery)  DIAGNOSIS:  Encounter Diagnoses  Name Primary?   Malignant neoplasm of lower-outer quadrant of left breast of female, estrogen receptor positive ([facility]) Yes   Age-related osteoporosis without current pathological fracture     SUMMARY OF ONCOLOGIC HISTORY: Oncology History  Malignant neoplasm of lower-outer quadrant of left breast of female, estrogen receptor positive ([facility])  02/02/2022 Initial Diagnosis   Screening mammogram detected subtle architectural distortion lower left breast (posterior to the site of previous surgical excision): Grade 1 IDC with DCIS ER 100%, PR 10%, Ki-67 less than 5%, HER2 1+ negative   02/07/2022 Cancer Staging   Staging form: Breast, AJCC 8th Edition - Clinical: Stage IA (cT1, cN0, cM0, G1, ER+, PR+, HER2-) - Signed by Serena Croissant, MD on 02/07/2022 Stage prefix: Initial diagnosis Histologic grading system: 3 grade system   02/14/2022 Surgery   Left lumpectomy: Grade 1 IDC 1.7 cm with DCIS intermediate grade, margins negative, ER 100%, PR 10%, HER2 negative with IHC 1+, Ki-67 less than 5%   02/23/2022 -  Anti-estrogen oral therapy   Letrozole x 5 years     CHIEF COMPLIANT: Breast cancer surveillance/ letrozole  INTERVAL HISTORY: Diamond [name] is a 88 y.o. female is here because of recent diagnosis of left breast cancer. She presents to the clinic for a follow-up.  She reports that she is tolerating the letrozole. She denies any hot flashes but does have some joint stiffness. She doesn't get time for exercise due to high traffic where she lives.   ALLERGIES:  is allergic to penicillins.  MEDICATIONS:  Current Outpatient Medications  Medication Sig Dispense Refill   alendronate (FOSAMAX) 70 MG tablet Take 1 tablet (70 mg total) by mouth  once a week. Take with a full glass of water on an empty stomach. 12 tablet 3   Cholecalciferol (VITAMIN D3 PO) Take 1 tablet by mouth at bedtime.     letrozole (FEMARA) 2.5 MG tablet Take 1 tablet (2.5 mg total) by mouth daily. 90 tablet 3   olmesartan (BENICAR) 20 MG tablet Take 20 mg by mouth daily.     olmesartan (BENICAR) 20 MG tablet Take 20 mg by mouth daily.     Probiotic Product (ALIGN) 4 MG CAPS Take by mouth.     tretinoin (RETIN-A) 0.1 % cream Apply 1 application topically at bedtime.     No current facility-administered medications for this visit.    PHYSICAL EXAMINATION: ECOG PERFORMANCE STATUS: 1 - Symptomatic but completely ambulatory  Vitals:   11/27/22 0856  BP: (!) 147/74  Pulse: 85  Resp: 18  Temp: 97.6 F (36.4 C)  SpO2: 97%   Filed Weights   11/27/22 0856  Weight: 125 lb (56.7 kg)    BREAST: No palpable masses or nodules in either right or left breasts. No palpable axillary supraclavicular or infraclavicular adenopathy no breast tenderness or nipple discharge. (exam performed in the presence of a chaperone)  LABORATORY DATA:  I have reviewed the data as listed     No data to display          Lab Results  Component Value Date   WBC 6.4 11/27/2022   HGB 15.6 (H) 11/27/2022   HCT 47.6 (H) 11/27/2022   MCV 92.2 11/27/2022   PLT 267 11/27/2022   NEUTROABS 4.7 11/27/2022    ASSESSMENT &  PLAN:  Malignant neoplasm of lower-outer quadrant of left breast of female, estrogen receptor positive ([facility]) 02/14/2022:Left lumpectomy: Grade 1 IDC 1.7 cm with DCIS intermediate grade, margins negative, ER 100%, PR 10%, HER2 negative with IHC 1+, Ki-67 less than 5%   Treatment plan: Adjuvant antiestrogen therapy with letrozole 2.5 mg daily x5 years Radiation was felt to be optional   Letrozole toxicities: Joint stiffness in the morning: Gets better when she exercises or walks  Breast cancer surveillance: Breast exam 11/27/2022: Benign Mammogram to be done in  June 2024 Bone density 06/13/2022: T-score -3.9: Severe osteoporosis  Severe osteoporosis: Patient forgets to take Fosamax.  I recommended stopping Fosamax and switching her to Prolia every 6 months.  She has no dental issues.  Hence she does not need to see a dentist at this time.  Return to clinic in 1 year for follow-up   No orders of the defined types were placed in this encounter.  The patient has a good understanding of the overall plan. she agrees with it. she will call with any problems that may develop before the next visit here. Total time spent: 30 mins including face to face time and time spent for planning, charting and co-ordination of care   Tamsen Meek, MD 11/27/22   Patient's insurance does not provide support for Prolia.  Therefore we will prescribe Zometa every 6 months

## 2022-11-27 NOTE — Addendum Note (Signed)
Addended by: Serena Croissant on: 11/27/2022 01:52 PM   Modules accepted: Orders

## 2022-11-27 NOTE — Progress Notes (Signed)
Received message from PA team that Prolia is non preferred and insurance prefers Zometa.  MD notified and orders changed.  Pt notified and verbalized understanding.  Message sent to scheduling team to adjust appts.  

## 2022-11-27 NOTE — Assessment & Plan Note (Signed)
02/14/2022:Left lumpectomy: Grade 1 IDC 1.7 cm with DCIS intermediate grade, margins negative, ER 100%, PR 10%, HER2 negative with IHC 1+, Ki-67 less than 5%   Treatment plan: Adjuvant antiestrogen therapy with letrozole 2.5 mg daily x5 years Radiation was felt to be optional   Letrozole toxicities:   Breast cancer surveillance: Breast exam 11/27/2022: Benign Mammogram to be done in June 2024 Bone density 06/13/2022: T-score -3.9: Severe osteoporosis  Return to clinic in 1 year for follow-up

## 2022-12-03 ENCOUNTER — Other Ambulatory Visit: Payer: Self-pay

## 2022-12-03 DIAGNOSIS — Z170 Estrogen receptor positive status [ER+]: Secondary | ICD-10-CM

## 2022-12-04 ENCOUNTER — Inpatient Hospital Stay: Payer: Medicare Other

## 2022-12-04 ENCOUNTER — Inpatient Hospital Stay: Payer: Medicare Other | Attending: Hematology and Oncology

## 2022-12-04 ENCOUNTER — Other Ambulatory Visit: Payer: Self-pay

## 2022-12-04 VITALS — BP 146/72 | HR 73 | Temp 97.8°F | Resp 17

## 2022-12-04 DIAGNOSIS — M810 Age-related osteoporosis without current pathological fracture: Secondary | ICD-10-CM

## 2022-12-04 DIAGNOSIS — Z170 Estrogen receptor positive status [ER+]: Secondary | ICD-10-CM | POA: Insufficient documentation

## 2022-12-04 DIAGNOSIS — C50512 Malignant neoplasm of lower-outer quadrant of left female breast: Secondary | ICD-10-CM | POA: Diagnosis present

## 2022-12-04 DIAGNOSIS — Z79811 Long term (current) use of aromatase inhibitors: Secondary | ICD-10-CM | POA: Diagnosis not present

## 2022-12-04 LAB — CBC WITH DIFFERENTIAL (CANCER CENTER ONLY)
Abs Immature Granulocytes: 0.01 10*3/uL (ref 0.00–0.07)
Basophils Absolute: 0 10*3/uL (ref 0.0–0.1)
Basophils Relative: 1 %
Eosinophils Absolute: 0.2 10*3/uL (ref 0.0–0.5)
Eosinophils Relative: 3 %
HCT: 44.1 % (ref 36.0–46.0)
Hemoglobin: 14.9 g/dL (ref 12.0–15.0)
Immature Granulocytes: 0 %
Lymphocytes Relative: 17 %
Lymphs Abs: 1.2 10*3/uL (ref 0.7–4.0)
MCH: 30.5 pg (ref 26.0–34.0)
MCHC: 33.8 g/dL (ref 30.0–36.0)
MCV: 90.4 fL (ref 80.0–100.0)
Monocytes Absolute: 0.7 10*3/uL (ref 0.1–1.0)
Monocytes Relative: 11 %
Neutro Abs: 4.6 10*3/uL (ref 1.7–7.7)
Neutrophils Relative %: 68 %
Platelet Count: 241 10*3/uL (ref 150–400)
RBC: 4.88 MIL/uL (ref 3.87–5.11)
RDW: 12.9 % (ref 11.5–15.5)
WBC Count: 6.7 10*3/uL (ref 4.0–10.5)
nRBC: 0 % (ref 0.0–0.2)

## 2022-12-04 LAB — CMP (CANCER CENTER ONLY)
ALT: 12 U/L (ref 0–44)
AST: 17 U/L (ref 15–41)
Albumin: 4 g/dL (ref 3.5–5.0)
Alkaline Phosphatase: 73 U/L (ref 38–126)
Anion gap: 7 (ref 5–15)
BUN: 17 mg/dL (ref 8–23)
CO2: 24 mmol/L (ref 22–32)
Calcium: 9.2 mg/dL (ref 8.9–10.3)
Chloride: 108 mmol/L (ref 98–111)
Creatinine: 0.7 mg/dL (ref 0.44–1.00)
GFR, Estimated: >60 mL/min (ref >60)
Glucose, Bld: 105 mg/dL — ABNORMAL HIGH (ref 70–99)
Potassium: 4.1 mmol/L (ref 3.5–5.1)
Sodium: 139 mmol/L (ref 135–145)
Total Bilirubin: 0.5 mg/dL (ref 0.3–1.2)
Total Protein: 6.4 g/dL — ABNORMAL LOW (ref 6.5–8.1)

## 2022-12-04 MED ORDER — ZOLEDRONIC ACID 4 MG/100ML IV SOLN
4.0000 mg | Freq: Once | INTRAVENOUS | Status: AC
  Administered 2022-12-04: 4 mg via INTRAVENOUS
  Filled 2022-12-04: qty 100

## 2022-12-04 MED ORDER — SODIUM CHLORIDE 0.9 % IV SOLN: Freq: Once | INTRAVENOUS | Status: AC

## 2022-12-04 NOTE — Patient Instructions (Signed)
[facility]  Discharge Instructions: Thank you for choosing [facility] to provide your oncology and hematology care.   If you have a lab appointment with the Cancer Center, please go directly to the Cancer Center and check in at the registration area.   Wear comfortable clothing and clothing appropriate for easy access to any Portacath or PICC line.   We strive to give you quality time with your provider. You may need to reschedule your appointment if you arrive late (15 or more minutes).  Arriving late affects you and other patients whose appointments are after yours.  Also, if you miss three or more appointments without notifying the office, you may be dismissed from the clinic at the provider's discretion.      For prescription refill requests, have your pharmacy contact our office and allow 72 hours for refills to be completed.    Zoledronic Acid Injection (Cancer) What is this medication? ZOLEDRONIC ACID (ZOE le dron ik AS id) treats high calcium levels in the blood caused by cancer. It may also be used with chemotherapy to treat weakened bones caused by cancer. It works by slowing down the release of calcium from bones. This lowers calcium levels in your blood. It also makes your bones stronger and less likely to break (fracture). It belongs to a group of medications called bisphosphonates. This medicine may be used for other purposes; ask your health care provider or pharmacist if you have questions. COMMON BRAND NAME(S): Zometa, Zometa Powder What should I tell my care team before I take this medication? They need to know if you have any of these conditions: Dehydration Dental disease Kidney disease Liver disease Low levels of calcium in the blood Lung or breathing disease, such as asthma Receiving steroids, such as dexamethasone or prednisone An unusual or allergic reaction to zoledronic acid, other medications, foods, dyes, or  preservatives Pregnant or trying to get pregnant Breast-feeding How should I use this medication? This medication is injected into a vein. It is given by your care team in a hospital or clinic setting. Talk to your care team about the use of this medication in children. Special care may be needed. Overdosage: If you think you have taken too much of this medicine contact a poison control center or emergency room at once. NOTE: This medicine is only for you. Do not share this medicine with others. What if I miss a dose? Keep appointments for follow-up doses. It is important not to miss your dose. Call your care team if you are unable to keep an appointment. What may interact with this medication? Certain antibiotics given by injection Diuretics, such as bumetanide, furosemide NSAIDs, medications for pain and inflammation, such as ibuprofen or naproxen Teriparatide Thalidomide This list may not describe all possible interactions. Give your health care provider a list of all the medicines, herbs, non-prescription drugs, or dietary supplements you use. Also tell them if you smoke, drink alcohol, or use illegal drugs. Some items may interact with your medicine. What should I watch for while using this medication? Visit your care team for regular checks on your progress. It may be some time before you see the benefit from this medication. Some people who take this medication have severe bone, joint, or muscle pain. This medication may also increase your risk for jaw problems or a broken thigh bone. Tell your care team right away if you have severe pain in your jaw, bones, joints, or  muscles. Tell you care team if you have any pain that does not go away or that gets worse. Tell your dentist and dental surgeon that you are taking this medication. You should not have major dental surgery while on this medication. See your dentist to have a dental exam and fix any dental problems before starting this  medication. Take good care of your teeth while on this medication. Make sure you see your dentist for regular follow-up appointments. You should make sure you get enough calcium and vitamin D while you are taking this medication. Discuss the foods you eat and the vitamins you take with your care team. Check with your care team if you have severe diarrhea, nausea, and vomiting, or if you sweat a lot. The loss of too much body fluid may make it dangerous for you to take this medication. You may need bloodwork while taking this medication. Talk to your care team if you wish to become pregnant or think you might be pregnant. This medication can cause serious birth defects. What side effects may I notice from receiving this medication? Side effects that you should report to your care team as soon as possible: Allergic reactions--skin rash, itching, hives, swelling of the face, lips, tongue, or throat Kidney injury--decrease in the amount of urine, swelling of the ankles, hands, or feet Low calcium level--muscle pain or cramps, confusion, tingling, or numbness in the hands or feet Osteonecrosis of the jaw--pain, swelling, or redness in the mouth, numbness of the jaw, poor healing after dental work, unusual discharge from the mouth, visible bones in the mouth Severe bone, joint, or muscle pain Side effects that usually do not require medical attention (report to your care team if they continue or are bothersome): Constipation Fatigue Fever Loss of appetite Nausea Stomach pain This list may not describe all possible side effects. Call your doctor for medical advice about side effects. You may report side effects to FDA at 1-800-FDA-1088. Where should I keep my medication? This medication is given in a hospital or clinic. It will not be stored at home. NOTE: This sheet is a summary. It may not cover all possible information. If you have questions about this medicine, talk to your doctor, pharmacist, or  health care provider.  2023 Elsevier/Gold Standard (2007-09-06 00:00:00)

## 2023-01-25 ENCOUNTER — Ambulatory Visit
Admission: RE | Admit: 2023-01-25 | Discharge: 2023-01-25 | Disposition: A | Discharge status: Home / Self Care | Payer: Medicare Other | Source: Ambulatory Visit | Attending: Adult Health | Admitting: Adult Health

## 2023-01-25 DIAGNOSIS — C50512 Malignant neoplasm of lower-outer quadrant of left female breast: Secondary | ICD-10-CM

## 2023-02-18 ENCOUNTER — Other Ambulatory Visit: Payer: Self-pay | Admitting: Hematology and Oncology

## 2023-05-20 ENCOUNTER — Encounter: Payer: Self-pay | Admitting: Hematology and Oncology

## 2023-06-05 ENCOUNTER — Other Ambulatory Visit: Payer: Self-pay | Admitting: *Deleted

## 2023-06-05 DIAGNOSIS — C50512 Malignant neoplasm of lower-outer quadrant of left female breast: Secondary | ICD-10-CM

## 2023-06-06 ENCOUNTER — Inpatient Hospital Stay: Payer: Medicare Other | Attending: Hematology and Oncology

## 2023-06-06 ENCOUNTER — Inpatient Hospital Stay: Payer: Medicare Other

## 2023-06-06 VITALS — BP 109/66 | HR 95 | Temp 97.8°F | Resp 18

## 2023-06-06 DIAGNOSIS — M810 Age-related osteoporosis without current pathological fracture: Secondary | ICD-10-CM | POA: Insufficient documentation

## 2023-06-06 DIAGNOSIS — C50512 Malignant neoplasm of lower-outer quadrant of left female breast: Secondary | ICD-10-CM | POA: Insufficient documentation

## 2023-06-06 DIAGNOSIS — Z170 Estrogen receptor positive status [ER+]: Secondary | ICD-10-CM | POA: Insufficient documentation

## 2023-06-06 DIAGNOSIS — Z79811 Long term (current) use of aromatase inhibitors: Secondary | ICD-10-CM | POA: Diagnosis not present

## 2023-06-06 LAB — CBC WITH DIFFERENTIAL (CANCER CENTER ONLY)
Abs Immature Granulocytes: 0.03 10*3/uL (ref 0.00–0.07)
Basophils Absolute: 0 10*3/uL (ref 0.0–0.1)
Basophils Relative: 1 %
Eosinophils Absolute: 0.1 10*3/uL (ref 0.0–0.5)
Eosinophils Relative: 1 %
HCT: 47.3 % — ABNORMAL HIGH (ref 36.0–46.0)
Hemoglobin: 15.7 g/dL — ABNORMAL HIGH (ref 12.0–15.0)
Immature Granulocytes: 0 %
Lymphocytes Relative: 18 %
Lymphs Abs: 1.4 10*3/uL (ref 0.7–4.0)
MCH: 30.8 pg (ref 26.0–34.0)
MCHC: 33.2 g/dL (ref 30.0–36.0)
MCV: 92.7 fL (ref 80.0–100.0)
Monocytes Absolute: 0.6 10*3/uL (ref 0.1–1.0)
Monocytes Relative: 8 %
Neutro Abs: 5.8 10*3/uL (ref 1.7–7.7)
Neutrophils Relative %: 72 %
Platelet Count: 252 10*3/uL (ref 150–400)
RBC: 5.1 MIL/uL (ref 3.87–5.11)
RDW: 13.1 % (ref 11.5–15.5)
WBC Count: 8 10*3/uL (ref 4.0–10.5)
nRBC: 0 % (ref 0.0–0.2)

## 2023-06-06 LAB — CMP (CANCER CENTER ONLY)
ALT: 13 U/L (ref 0–44)
AST: 18 U/L (ref 15–41)
Albumin: 4.1 g/dL (ref 3.5–5.0)
Alkaline Phosphatase: 58 U/L (ref 38–126)
Anion gap: 5 (ref 5–15)
BUN: 20 mg/dL (ref 8–23)
CO2: 29 mmol/L (ref 22–32)
Calcium: 9.8 mg/dL (ref 8.9–10.3)
Chloride: 106 mmol/L (ref 98–111)
Creatinine: 0.95 mg/dL (ref 0.44–1.00)
GFR, Estimated: 58 mL/min — ABNORMAL LOW (ref >60)
Glucose, Bld: 160 mg/dL — ABNORMAL HIGH (ref 70–99)
Potassium: 4.2 mmol/L (ref 3.5–5.1)
Sodium: 140 mmol/L (ref 135–145)
Total Bilirubin: 0.6 mg/dL (ref <1.2)
Total Protein: 6.7 g/dL (ref 6.5–8.1)

## 2023-06-06 MED ORDER — ZOLEDRONIC ACID 4 MG/100ML IV SOLN
4.0000 mg | Freq: Once | INTRAVENOUS | Status: AC
  Administered 2023-06-06: 4 mg via INTRAVENOUS
  Filled 2023-06-06: qty 100

## 2023-06-06 MED ORDER — SODIUM CHLORIDE 0.9 % IV SOLN: Freq: Once | INTRAVENOUS | Status: AC

## 2023-06-06 NOTE — Patient Instructions (Signed)
[facility] - A DEPT OF [facility]  Discharge Instructions: Thank you for choosing [facility] to provide your oncology and hematology care.   If you have a lab appointment with the Cancer Center, please go directly to the Cancer Center and check in at the registration area.   Wear comfortable clothing and clothing appropriate for easy access to any Portacath or PICC line.   We strive to give you quality time with your provider. You may need to reschedule your appointment if you arrive late (15 or more minutes).  Arriving late affects you and other patients whose appointments are after yours.  Also, if you miss three or more appointments without notifying the office, you may be dismissed from the clinic at the provider's discretion.      For prescription refill requests, have your pharmacy contact our office and allow 72 hours for refills to be completed.    Zoledronic Acid Injection (Cancer) What is this medication? ZOLEDRONIC ACID (ZOE le dron ik AS id) treats high calcium levels in the blood caused by cancer. It may also be used with chemotherapy to treat weakened bones caused by cancer. It works by slowing down the release of calcium from bones. This lowers calcium levels in your blood. It also makes your bones stronger and less likely to break (fracture). It belongs to a group of medications called bisphosphonates. This medicine may be used for other purposes; ask your health care provider or pharmacist if you have questions. COMMON BRAND NAME(S): Zometa, Zometa Powder What should I tell my care team before I take this medication? They need to know if you have any of these conditions: Dehydration Dental disease Kidney disease Liver disease Low levels of calcium in the blood Lung or breathing disease, such as asthma Receiving steroids, such as dexamethasone or prednisone An unusual or allergic reaction to zoledronic acid, other  medications, foods, dyes, or preservatives Pregnant or trying to get pregnant Breast-feeding How should I use this medication? This medication is injected into a vein. It is given by your care team in a hospital or clinic setting. Talk to your care team about the use of this medication in children. Special care may be needed. Overdosage: If you think you have taken too much of this medicine contact a poison control center or emergency room at once. NOTE: This medicine is only for you. Do not share this medicine with others. What if I miss a dose? Keep appointments for follow-up doses. It is important not to miss your dose. Call your care team if you are unable to keep an appointment. What may interact with this medication? Certain antibiotics given by injection Diuretics, such as bumetanide, furosemide NSAIDs, medications for pain and inflammation, such as ibuprofen or naproxen Teriparatide Thalidomide This list may not describe all possible interactions. Give your health care provider a list of all the medicines, herbs, non-prescription drugs, or dietary supplements you use. Also tell them if you smoke, drink alcohol, or use illegal drugs. Some items may interact with your medicine. What should I watch for while using this medication? Visit your care team for regular checks on your progress. It may be some time before you see the benefit from this medication. Some people who take this medication have severe bone, joint, or muscle pain. This medication may also increase your risk for jaw problems or a broken thigh bone. Tell your care team right away if you have severe pain in  your jaw, bones, joints, or muscles. Tell you care team if you have any pain that does not go away or that gets worse. Tell your dentist and dental surgeon that you are taking this medication. You should not have major dental surgery while on this medication. See your dentist to have a dental exam and fix any dental  problems before starting this medication. Take good care of your teeth while on this medication. Make sure you see your dentist for regular follow-up appointments. You should make sure you get enough calcium and vitamin D while you are taking this medication. Discuss the foods you eat and the vitamins you take with your care team. Check with your care team if you have severe diarrhea, nausea, and vomiting, or if you sweat a lot. The loss of too much body fluid may make it dangerous for you to take this medication. You may need bloodwork while taking this medication. Talk to your care team if you wish to become pregnant or think you might be pregnant. This medication can cause serious birth defects. What side effects may I notice from receiving this medication? Side effects that you should report to your care team as soon as possible: Allergic reactions--skin rash, itching, hives, swelling of the face, lips, tongue, or throat Kidney injury--decrease in the amount of urine, swelling of the ankles, hands, or feet Low calcium level--muscle pain or cramps, confusion, tingling, or numbness in the hands or feet Osteonecrosis of the jaw--pain, swelling, or redness in the mouth, numbness of the jaw, poor healing after dental work, unusual discharge from the mouth, visible bones in the mouth Severe bone, joint, or muscle pain Side effects that usually do not require medical attention (report to your care team if they continue or are bothersome): Constipation Fatigue Fever Loss of appetite Nausea Stomach pain This list may not describe all possible side effects. Call your doctor for medical advice about side effects. You may report side effects to FDA at 1-800-FDA-1088. Where should I keep my medication? This medication is given in a hospital or clinic. It will not be stored at home. NOTE: This sheet is a summary. It may not cover all possible information. If you have questions about this medicine, talk  to your doctor, pharmacist, or health care provider.  2023 Elsevier/Gold Standard (2007-09-06 00:00:00)

## 2023-08-07 ENCOUNTER — Telehealth: Payer: Self-pay | Admitting: *Deleted

## 2023-08-07 NOTE — Telephone Encounter (Signed)
 Received call from pt with complaint of rash on the neck that is red and itches.  Pt denies any recent injury or trauma.  Requesting advice from MD if rash is related to Letrozole .  Per MD pt has been on Letrozole  for 1.5 years and should not be contributing to the rash.  Pt advised to apply OTC cortisone cream and f/u with PCP if symptoms do not improve.  Pt verbalized understanding.

## 2023-12-03 ENCOUNTER — Other Ambulatory Visit: Payer: Self-pay | Admitting: *Deleted

## 2023-12-03 DIAGNOSIS — Z170 Estrogen receptor positive status [ER+]: Secondary | ICD-10-CM

## 2023-12-04 ENCOUNTER — Inpatient Hospital Stay: Payer: Medicare Other | Attending: Hematology and Oncology | Admitting: Hematology and Oncology

## 2023-12-04 ENCOUNTER — Inpatient Hospital Stay: Payer: Medicare Other

## 2023-12-04 VITALS — BP 150/73 | HR 73 | Temp 97.4°F | Resp 18 | Ht 63.0 in | Wt 123.5 lb

## 2023-12-04 DIAGNOSIS — C50512 Malignant neoplasm of lower-outer quadrant of left female breast: Secondary | ICD-10-CM | POA: Insufficient documentation

## 2023-12-04 DIAGNOSIS — M810 Age-related osteoporosis without current pathological fracture: Secondary | ICD-10-CM | POA: Diagnosis present

## 2023-12-04 DIAGNOSIS — Z780 Asymptomatic menopausal state: Secondary | ICD-10-CM

## 2023-12-04 DIAGNOSIS — Z170 Estrogen receptor positive status [ER+]: Secondary | ICD-10-CM | POA: Diagnosis not present

## 2023-12-04 DIAGNOSIS — Z79811 Long term (current) use of aromatase inhibitors: Secondary | ICD-10-CM | POA: Insufficient documentation

## 2023-12-04 LAB — CBC WITH DIFFERENTIAL (CANCER CENTER ONLY)
Abs Immature Granulocytes: 0.03 10*3/uL (ref 0.00–0.07)
Basophils Absolute: 0.1 10*3/uL (ref 0.0–0.1)
Basophils Relative: 1 %
Eosinophils Absolute: 0.1 10*3/uL (ref 0.0–0.5)
Eosinophils Relative: 2 %
HCT: 47.3 % — ABNORMAL HIGH (ref 36.0–46.0)
Hemoglobin: 15.7 g/dL — ABNORMAL HIGH (ref 12.0–15.0)
Immature Granulocytes: 1 %
Lymphocytes Relative: 17 %
Lymphs Abs: 1.1 10*3/uL (ref 0.7–4.0)
MCH: 30 pg (ref 26.0–34.0)
MCHC: 33.2 g/dL (ref 30.0–36.0)
MCV: 90.4 fL (ref 80.0–100.0)
Monocytes Absolute: 0.6 10*3/uL (ref 0.1–1.0)
Monocytes Relative: 10 %
Neutro Abs: 4.3 10*3/uL (ref 1.7–7.7)
Neutrophils Relative %: 69 %
Platelet Count: 265 10*3/uL (ref 150–400)
RBC: 5.23 MIL/uL — ABNORMAL HIGH (ref 3.87–5.11)
RDW: 13.2 % (ref 11.5–15.5)
WBC Count: 6.2 10*3/uL (ref 4.0–10.5)
nRBC: 0 % (ref 0.0–0.2)

## 2023-12-04 LAB — CMP (CANCER CENTER ONLY)
ALT: 14 U/L (ref 0–44)
AST: 18 U/L (ref 15–41)
Albumin: 4.3 g/dL (ref 3.5–5.0)
Alkaline Phosphatase: 68 U/L (ref 38–126)
Anion gap: 8 (ref 5–15)
BUN: 12 mg/dL (ref 8–23)
CO2: 28 mmol/L (ref 22–32)
Calcium: 9.6 mg/dL (ref 8.9–10.3)
Chloride: 104 mmol/L (ref 98–111)
Creatinine: 0.85 mg/dL (ref 0.44–1.00)
GFR, Estimated: >60 mL/min (ref >60)
Glucose, Bld: 88 mg/dL (ref 70–99)
Potassium: 4.4 mmol/L (ref 3.5–5.1)
Sodium: 140 mmol/L (ref 135–145)
Total Bilirubin: 0.6 mg/dL (ref 0.0–1.2)
Total Protein: 6.9 g/dL (ref 6.5–8.1)

## 2023-12-04 MED ORDER — SODIUM CHLORIDE 0.9 % IV SOLN: Freq: Once | INTRAVENOUS | Status: AC

## 2023-12-04 MED ORDER — ZOLEDRONIC ACID 4 MG/100ML IV SOLN
4.0000 mg | Freq: Once | INTRAVENOUS | Status: AC
Start: 2023-12-04 — End: 2023-12-04
  Administered 2023-12-04: 4 mg via INTRAVENOUS
  Filled 2023-12-04: qty 100

## 2023-12-04 NOTE — Patient Instructions (Signed)

## 2023-12-04 NOTE — Assessment & Plan Note (Signed)
 02/14/2022:Left lumpectomy: Grade 1 IDC 1.7 cm with DCIS intermediate grade, margins negative, ER 100%, PR 10%, HER2 negative with IHC 1+, Ki-67 less than 5%    Treatment plan: Adjuvant antiestrogen therapy with letrozole  2.5 mg daily x5 years started August 2023 Radiation was felt to be optional   Letrozole  toxicities: Joint stiffness in the morning: Gets better when she exercises or walks   Breast cancer surveillance: Breast exam 12/04/2023: Benign Mammogram 01/25/2023: Benign breast density category B Bone density 06/13/2022: T-score -3.9: Severe osteoporosis   Severe osteoporosis: Currently on Prolia every 6 months   Return to clinic in 1 year for follow-up

## 2023-12-04 NOTE — Progress Notes (Signed)
 Patient Care Team: Suan Elm, MD as PCP - General (Internal Medicine) Cameron Cea, MD as Consulting Physician (Hematology and Oncology) Enid Harry, MD as Consulting Physician (General Surgery)  DIAGNOSIS:  Encounter Diagnosis  Name Primary?   Malignant neoplasm of lower-outer quadrant of left breast of female, estrogen receptor positive (HCC) Yes    SUMMARY OF ONCOLOGIC HISTORY: Oncology History  Malignant neoplasm of lower-outer quadrant of left breast of female, estrogen receptor positive (HCC)  02/02/2022 Initial Diagnosis   Screening mammogram detected subtle architectural distortion lower left breast (posterior to the site of previous surgical excision): Grade 1 IDC with DCIS ER 100%, PR 10%, Ki-67 less than 5%, HER2 1+ negative   02/07/2022 Cancer Staging   Staging form: Breast, AJCC 8th Edition - Clinical: Stage IA (cT1, cN0, cM0, G1, ER+, PR+, HER2-) - Signed by Cameron Cea, MD on 02/07/2022 Stage prefix: Initial diagnosis Histologic grading system: 3 grade system   02/14/2022 Surgery   Left lumpectomy: Grade 1 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 She is accompanied by her daughter, Diamond [name]. She was referred by her son-in-law, Diamond [name], a radiation oncologist.  She began Letrozole  treatment two years ago and continues to take it. She is due for a mammogram in June or July. She experiences joint stiffness, particularly in her hip, with occasional radiation down her leg. She is receiving Zometa  infusions for bone health without issues. Her last bone density scan was in November 2023, with the next  due in November this year.     ALLERGIES:  is allergic to penicillins.  MEDICATIONS:  Current Outpatient Medications  Medication Sig Dispense Refill   Cholecalciferol (VITAMIN D3 PO) Take 1 tablet by mouth at bedtime.     letrozole  (FEMARA ) 2.5 MG tablet TAKE ONE TABLET BY MOUTH EVERY DAY 90 tablet 3   olmesartan (BENICAR) 20 MG tablet Take 20 mg by mouth daily.     olmesartan (BENICAR) 20 MG tablet Take 20 mg by mouth daily.     Probiotic Product (ALIGN) 4 MG CAPS Take by mouth.     tretinoin (RETIN-A) 0.1 % cream Apply 1 application topically at bedtime.     No current facility-administered medications for this visit.    PHYSICAL EXAMINATION: ECOG PERFORMANCE STATUS: 1 - Symptomatic but completely ambulatory  Vitals:   12/04/23 0918  BP: (!) 150/73  Pulse: 73  Resp: 18  Temp: (!) 97.4 F (36.3 C)  SpO2: 99%   Filed Weights   12/04/23 0918  Weight: 123 lb 8 oz (56 kg)      LABORATORY DATA:  I have reviewed the data as listed    Latest Ref Rng & Units 12/04/2023    8:34 AM 06/06/2023   12:24 PM 12/04/2022    1:16 PM  CMP  Glucose 70 - 99 mg/dL 88  161  096   BUN 8 - 23 mg/dL 12  20  17    Creatinine 0.44 - 1.00 mg/dL 0.45  4.09  8.11   Sodium 135 - 145 mmol/L 140  140  139   Potassium 3.5 - 5.1 mmol/L 4.4  4.2  4.1   Chloride 98 - 111 mmol/L 104  106  108   CO2 22 - 32 mmol/L 28  29  24    Calcium 8.9 - 10.3 mg/dL 9.6  9.8  9.2   Total Protein 6.5 - 8.1 g/dL 6.9  6.7  6.4   Total Bilirubin 0.0 - 1.2 mg/dL 0.6  0.6  0.5   Alkaline Phos 38 - 126 U/L 68  58  73   AST 15 - 41 U/L 18  18  17    ALT 0 - 44 U/L 14  13  12      Lab Results  Component Value Date   WBC 6.2 12/04/2023   HGB 15.7 (H) 12/04/2023   HCT 47.3 (H) 12/04/2023   MCV 90.4 12/04/2023   PLT 265 12/04/2023   NEUTROABS 4.3 12/04/2023    ASSESSMENT & PLAN:  Malignant neoplasm of lower-outer quadrant of left breast of female, estrogen receptor positive (HCC) 02/14/2022:Left lumpectomy: Grade 1 IDC  1.7 cm with DCIS intermediate grade, margins negative, ER 100%, PR 10%, HER2 negative with IHC 1+, Ki-67 less than 5%    Treatment plan: Adjuvant antiestrogen therapy with letrozole  2.5 mg daily x5 years started August 2023 Radiation was felt to be optional   Letrozole  toxicities: Joint stiffness in the morning: Gets better when she exercises or walks   Breast cancer surveillance: Breast exam 12/04/2023: Benign Mammogram 01/25/2023: Benign breast density category B Bone density 06/13/2022: T-score -3.9: Severe osteoporosis   Severe osteoporosis: Currently on Prolia every 6 months   Return to clinic in 1 year for follow-up ------------------------------------- Assessment and Plan Assessment & Plan Malignant neoplasm of lower-outer quadrant of left breast Estrogen receptor-positive breast cancer, stable on Letrozole . Bone health maintained with Zometa . - Continue Letrozole  2.5 mg orally daily. - Schedule mammogram for end of June or July. - Continue Zometa  infusions. - Schedule bone density scan for November 2025.  Joint stiffness Intermittent joint stiffness, possibly due to Letrozole , without significant changes. - Monitor symptoms.      No orders of the defined types were placed in this encounter.  The patient has a good understanding of the overall plan. she agrees with it. she will call with any problems that may develop before the next visit here. Total time spent: 30 mins including face to face time and time spent for planning, charting and co-ordination of care   Viinay K Kynsley Whitehouse, MD 12/04/23

## 2023-12-25 ENCOUNTER — Other Ambulatory Visit: Payer: Self-pay | Admitting: Adult Health

## 2023-12-25 DIAGNOSIS — Z9889 Other specified postprocedural states: Secondary | ICD-10-CM

## 2023-12-25 DIAGNOSIS — Z853 Personal history of malignant neoplasm of breast: Secondary | ICD-10-CM

## 2024-01-27 ENCOUNTER — Ambulatory Visit
Admission: RE | Admit: 2024-01-27 | Discharge: 2024-01-27 | Disposition: A | Discharge status: Home / Self Care | Source: Ambulatory Visit | Attending: Adult Health | Admitting: Adult Health

## 2024-01-27 DIAGNOSIS — Z853 Personal history of malignant neoplasm of breast: Secondary | ICD-10-CM

## 2024-01-27 DIAGNOSIS — Z9889 Other specified postprocedural states: Secondary | ICD-10-CM

## 2024-02-20 ENCOUNTER — Other Ambulatory Visit: Payer: Self-pay | Admitting: Hematology and Oncology

## 2024-06-19 ENCOUNTER — Inpatient Hospital Stay: Attending: Hematology and Oncology

## 2024-06-19 ENCOUNTER — Inpatient Hospital Stay

## 2024-06-19 ENCOUNTER — Other Ambulatory Visit: Payer: Self-pay | Admitting: *Deleted

## 2024-06-19 VITALS — BP 139/83 | HR 81 | Temp 97.9°F | Resp 16 | Wt 125.5 lb

## 2024-06-19 DIAGNOSIS — Z79811 Long term (current) use of aromatase inhibitors: Secondary | ICD-10-CM | POA: Diagnosis not present

## 2024-06-19 DIAGNOSIS — Z170 Estrogen receptor positive status [ER+]: Secondary | ICD-10-CM | POA: Diagnosis not present

## 2024-06-19 DIAGNOSIS — M810 Age-related osteoporosis without current pathological fracture: Secondary | ICD-10-CM | POA: Insufficient documentation

## 2024-06-19 DIAGNOSIS — C50512 Malignant neoplasm of lower-outer quadrant of left female breast: Secondary | ICD-10-CM

## 2024-06-19 LAB — CMP (CANCER CENTER ONLY)
ALT: 16 U/L (ref 0–44)
AST: 23 U/L (ref 15–41)
Albumin: 4.3 g/dL (ref 3.5–5.0)
Alkaline Phosphatase: 79 U/L (ref 38–126)
Anion gap: 9 (ref 5–15)
BUN: 20 mg/dL (ref 8–23)
CO2: 25 mmol/L (ref 22–32)
Calcium: 9.7 mg/dL (ref 8.9–10.3)
Chloride: 106 mmol/L (ref 98–111)
Creatinine: 0.81 mg/dL (ref 0.44–1.00)
GFR, Estimated: >60 mL/min (ref >60)
Glucose, Bld: 92 mg/dL (ref 70–99)
Potassium: 4.3 mmol/L (ref 3.5–5.1)
Sodium: 139 mmol/L (ref 135–145)
Total Bilirubin: 0.5 mg/dL (ref 0.0–1.2)
Total Protein: 6.6 g/dL (ref 6.5–8.1)

## 2024-06-19 LAB — CBC WITH DIFFERENTIAL (CANCER CENTER ONLY)
Abs Immature Granulocytes: 0.03 K/uL (ref 0.00–0.07)
Basophils Absolute: 0.1 K/uL (ref 0.0–0.1)
Basophils Relative: 1 %
Eosinophils Absolute: 0.1 K/uL (ref 0.0–0.5)
Eosinophils Relative: 2 %
HCT: 45.8 % (ref 36.0–46.0)
Hemoglobin: 15.1 g/dL — ABNORMAL HIGH (ref 12.0–15.0)
Immature Granulocytes: 0 %
Lymphocytes Relative: 13 %
Lymphs Abs: 1 K/uL (ref 0.7–4.0)
MCH: 30.7 pg (ref 26.0–34.0)
MCHC: 33 g/dL (ref 30.0–36.0)
MCV: 93.1 fL (ref 80.0–100.0)
Monocytes Absolute: 0.6 K/uL (ref 0.1–1.0)
Monocytes Relative: 8 %
Neutro Abs: 5.8 K/uL (ref 1.7–7.7)
Neutrophils Relative %: 76 %
Platelet Count: 264 K/uL (ref 150–400)
RBC: 4.92 MIL/uL (ref 3.87–5.11)
RDW: 12.1 % (ref 11.5–15.5)
WBC Count: 7.6 K/uL (ref 4.0–10.5)
nRBC: 0 % (ref 0.0–0.2)

## 2024-06-19 MED ORDER — SODIUM CHLORIDE 0.9 % IV SOLN: Freq: Once | INTRAVENOUS | Status: AC

## 2024-06-19 MED ORDER — ZOLEDRONIC ACID 4 MG/100ML IV SOLN
4.0000 mg | Freq: Once | INTRAVENOUS | Status: AC
  Administered 2024-06-19: 4 mg via INTRAVENOUS
  Filled 2024-06-19: qty 100

## 2024-06-19 NOTE — Patient Instructions (Signed)
 [facility] - A DEPT OF [place]  Discharge Instructions: Thank you for choosing [facility] to provide your oncology and hematology care.   If you have a lab appointment with the Cancer Center, please go directly to the Cancer Center and check in at the registration area.   Wear comfortable clothing and clothing appropriate for easy access to any Portacath or PICC line.   We strive to give you quality time with your provider. You may need to reschedule your appointment if you arrive late (15 or more minutes).  Arriving late affects you and other patients whose appointments are after yours.  Also, if you miss three or more appointments without notifying the office, you may be dismissed from the clinic at the provider's discretion.      For prescription refill requests, have your pharmacy contact our office and allow 72 hours for refills to be completed.    Today you received the following zometa  infusion today      To help prevent nausea and vomiting after your treatment, we encourage you to take your nausea medication as directed.  BELOW ARE SYMPTOMS THAT SHOULD BE REPORTED IMMEDIATELY: *FEVER GREATER THAN 100.4 F (38 C) OR HIGHER *CHILLS OR SWEATING *NAUSEA AND VOMITING THAT IS NOT CONTROLLED WITH YOUR NAUSEA MEDICATION *UNUSUAL SHORTNESS OF BREATH *UNUSUAL BRUISING OR BLEEDING *URINARY PROBLEMS (pain or burning when urinating, or frequent urination) *BOWEL PROBLEMS (unusual diarrhea, constipation, pain near the anus) TENDERNESS IN MOUTH AND THROAT WITH OR WITHOUT PRESENCE OF ULCERS (sore throat, sores in mouth, or a toothache) UNUSUAL RASH, SWELLING OR PAIN  UNUSUAL VAGINAL DISCHARGE OR ITCHING   Items with * indicate a potential emergency and should be followed up as soon as possible or go to the Emergency Department if any problems should occur.  Please show the CHEMOTHERAPY ALERT CARD or IMMUNOTHERAPY ALERT CARD at check-in to the  Emergency Department and triage nurse.  Should you have questions after your visit or need to cancel or reschedule your appointment, please contact [facility] - A DEPT [facility]  Dept: [phone]  and follow the prompts.  Office hours are 8:00 a.m. to 4:30 p.m. Monday - Friday. Please note that voicemails left after 4:00 p.m. may not be returned until the following business day.  We are closed weekends and major holidays. You have access to a nurse at all times for urgent questions. Please call the main number to the clinic Dept: [phone] and follow the prompts.   For any non-urgent questions, you may also contact your provider using MyChart. We now offer e-Visits for anyone 23 and older to request care online for non-urgent symptoms. For details visit [email or web address].   Also download the MyChart app! Go to the app store, search MyChart, open the app, select [place], and log in with your MyChart username and password.

## 2024-06-19 NOTE — Progress Notes (Signed)
 Pt is for zometa  today. Labs reviewed. Will proceed as planned.    Blood pressure is elevated this am. Will recheck after pt is settled in.   See flowsheet for recent Blood pressure.   Pt states she is taking vit D  Labs and Zometa  infusion given per orders. Patient tolerated it well without problems. Vitals stable and discharged home from clinic ambulatory. Follow up as scheduled.

## 2024-12-09 ENCOUNTER — Inpatient Hospital Stay

## 2024-12-09 ENCOUNTER — Ambulatory Visit: Admitting: Hematology and Oncology
